# Patient Record
Sex: Male | Born: 1955
Health system: Southern US, Community
[De-identification: ages and names within clinical notes are randomized; demographics above are authoritative.]

## PROBLEM LIST (undated history)

## (undated) DIAGNOSIS — I10 Essential (primary) hypertension: Secondary | ICD-10-CM

## (undated) DIAGNOSIS — Z87442 Personal history of urinary calculi: Secondary | ICD-10-CM

## (undated) DIAGNOSIS — R51 Headache: Secondary | ICD-10-CM

## (undated) DIAGNOSIS — Z634 Disappearance and death of family member: Secondary | ICD-10-CM

## (undated) DIAGNOSIS — G4700 Insomnia, unspecified: Secondary | ICD-10-CM

## (undated) DIAGNOSIS — Z8619 Personal history of other infectious and parasitic diseases: Secondary | ICD-10-CM

## (undated) DIAGNOSIS — G43909 Migraine, unspecified, not intractable, without status migrainosus: Secondary | ICD-10-CM

## (undated) DIAGNOSIS — R519 Headache, unspecified: Secondary | ICD-10-CM

## (undated) HISTORY — DX: Essential (primary) hypertension: I10

## (undated) HISTORY — DX: Insomnia, unspecified: G47.00

## (undated) HISTORY — DX: Headache: R51

## (undated) HISTORY — DX: Personal history of urinary calculi: Z87.442

## (undated) HISTORY — DX: Disappearance and death of family member: Z63.4

## (undated) HISTORY — DX: Headache, unspecified: R51.9

## (undated) HISTORY — DX: Migraine, unspecified, not intractable, without status migrainosus: G43.909

## (undated) HISTORY — DX: Personal history of other infectious and parasitic diseases: Z86.19

---

## 2014-05-19 ENCOUNTER — Ambulatory Visit: Payer: Self-pay | Admitting: Family Medicine

## 2015-01-13 ENCOUNTER — Ambulatory Visit: Payer: Self-pay | Admitting: Family Medicine

## 2015-02-16 ENCOUNTER — Encounter: Payer: Self-pay | Admitting: Family Medicine

## 2015-02-16 ENCOUNTER — Ambulatory Visit (INDEPENDENT_AMBULATORY_CARE_PROVIDER_SITE_OTHER): Payer: BLUE CROSS/BLUE SHIELD | Admitting: Family Medicine

## 2015-02-16 VITALS — BP 98/60 | HR 91 | Temp 97.9°F | Ht 70.5 in | Wt 190.7 lb

## 2015-02-16 DIAGNOSIS — G4700 Insomnia, unspecified: Secondary | ICD-10-CM | POA: Diagnosis not present

## 2015-02-16 DIAGNOSIS — Z23 Encounter for immunization: Secondary | ICD-10-CM

## 2015-02-16 DIAGNOSIS — E785 Hyperlipidemia, unspecified: Secondary | ICD-10-CM | POA: Insufficient documentation

## 2015-02-16 DIAGNOSIS — Z566 Other physical and mental strain related to work: Secondary | ICD-10-CM | POA: Insufficient documentation

## 2015-02-16 DIAGNOSIS — I10 Essential (primary) hypertension: Secondary | ICD-10-CM | POA: Insufficient documentation

## 2015-02-16 DIAGNOSIS — Z7689 Persons encountering health services in other specified circumstances: Secondary | ICD-10-CM

## 2015-02-16 DIAGNOSIS — Z7189 Other specified counseling: Secondary | ICD-10-CM

## 2015-02-16 MED ORDER — ZOLPIDEM TARTRATE 10 MG PO TABS: 5.0000 mg | ORAL_TABLET | Freq: Every evening | ORAL | Status: DC | PRN

## 2015-02-16 MED ORDER — LISINOPRIL 40 MG PO TABS: 40.0000 mg | ORAL_TABLET | Freq: Every day | ORAL | Status: DC

## 2015-02-16 NOTE — Patient Instructions (Signed)
BEFORE YOU LEAVE: -schedule physical in 3-4 months, come fasting and we will plan to do labs that day  Please schedule CBT with Dr. Jason FilaBray to help with sleep and we will plan to eventually try to wean slowly off of the Ambien.  We recommend the following healthy lifestyle measures: - eat a healthy whole foods diet consisting of regular small meals composed of vegetables, fruits, beans, nuts, seeds, healthy meats such as white chicken and fish and whole grains.  - avoid sweets, white starchy foods, fried foods, fast food, processed foods, sodas, red meet and other fattening foods.  - get a least 150-300 minutes of aerobic exercise per week.   FOR IMPROVED SLEEP AND TO RESET YOUR SLEEP SCHEDULE: []  exercise 30 minutes daily  []  go to bed and wake up at the same time  []  keep bedroom cool, dark and quiet  []  reserve bed for sleep - do not read, watch TV, etc in bed  []  If you toss and turn more then 15-20 minutes get out of bed and list thoughts/do quite activity then go back to bed; repeat as needed; do not worry about when you eventually fall asleep - still get up at the same time and turn on lights and take shower  [] get counseling  []  some people find that a half dose of benadryl, melatonin, tylenol pm or unisom on a few nights per week is helpful initially for a few weeks  [] seek help and treat any depression or anxiety  [] prescription strength sleep medications (such as Ambien) should only be used in severe cases of insomnia if other measures fail and should be used for as short of time as possible

## 2015-02-16 NOTE — Progress Notes (Signed)
Pre visit review using our clinic review tool, if applicable. No additional management support is needed unless otherwise documented below in the visit note. 

## 2015-02-16 NOTE — Progress Notes (Signed)
HPI:  Melvin Faulkner is here to establish care. Used to see Melvin Faulkner whom let practice. Wife and children come here.  HTN: -dx in his 35s -meds: lisinopril  daily -reports stable -reports hx mild HLD treated with diet and exercise  Insomnia: -started 6 years ago with the loss of his son -[place]  5 nights per week since -does suffer from a lot of stress/anxiety related to work -denies depression -interested in eventually stopping [place] but has not had success with stopping on his own -bedtime 11, wakes up at 5 every day  ROS negative for unless reported above: fevers, unintentional weight loss, hearing or vision loss, chest pain, palpitations, struggling to breath, hemoptysis, melena, hematochezia, hematuria, falls, loc, si, thoughts of self harm  Past Medical History  Diagnosis Date  . Hypertension   . Frequent headaches   . History of chicken pox   . Migraine   . History of kidney stones   . Insomnia   . Bereavement     lost son 2010    History reviewed. No pertinent past surgical history.  Family History  Problem Relation Age of Onset  . Arthritis Paternal Grandmother   . Alcoholism Daughter   . Congestive Heart Failure Mother   . Kidney disease Father   . Mental illness Son     suicide    Social History   Social History  . Marital Status: Married    Spouse Name: N/A  . Number of Children: N/A  . Years of Education: N/A   Social History Main Topics  . Smoking status: Never Smoker   . Smokeless tobacco: None  . Alcohol Use: 0.0 oz/week    0 Standard drinks or equivalent per week     Comment: 1 drink twice per month  . Drug Use: None  . Sexual Activity: Not Asked   Other Topics Concern  . None   Social History Narrative   Updated 01/2015   Work or Archivist - Holiday representative      Home Situation: lives with wife and children - wife and children see me      Spiritual Beliefs: Christian - strong faith      Lifestyle: runs  several miles per day, weights; healthy diet           Current outpatient prescriptions:  .  lisinopril (PRINIVIL,ZESTRIL) 40 MG tablet, Take 1 tablet (40 mg total) by mouth daily., Disp: 90 tablet, Rfl: 3 .  zolpidem (AMBIEN) 10 MG tablet, Take 0.5 tablets (5 mg total) by mouth at bedtime as needed for sleep., Disp: 45 tablet, Rfl: 1  EXAM:  Filed Vitals:   02/16/15 1315  BP: 98/60  Pulse: 91  Temp: 97.9 F (36.6 C)    Body mass index is 26.97 kg/(m^2).  GENERAL: vitals reviewed and listed above, alert, oriented, appears well hydrated and in no acute distress  HEENT: atraumatic, conjunttiva clear, no obvious abnormalities on inspection of external nose and ears  NECK: no obvious masses on inspection  LUNGS: clear to auscultation bilaterally, no wheezes, rales or rhonchi, good air movement  CV: HRRR, no peripheral edema  MS: moves all extremities without noticeable abnormality  PSYCH: pleasant and cooperative, no obvious depression or anxiety  ASSESSMENT AND PLAN:  Discussed the following assessment and plan:  Stress at work Insomnia -advised stress reduction options, CBT, sleep hygeine and at some point slow taper off ambien when he feels ready after discussion risks - he opted to continue short term  Essential hypertension -cont current tx  Hyperlipemia -lifestyle recs  CPE in 3-4 months with labs   Encounter to establish care -We reviewed the PMH, PSH, FH, SH, Meds and Allergies. -We provided refills for any medications we will prescribe as needed. -We addressed current concerns per orders and patient instructions. -We have asked for records for pertinent exams, studies, vaccines and notes from previous providers. -We have advised patient to follow up per instructions below.   -Patient advised to return or notify a doctor immediately if symptoms worsen or persist or new concerns arise.  Patient Instructions  BEFORE YOU LEAVE: -schedule physical in  3-4 months, come fasting and we will plan to do labs that day  Please schedule CBT with Dr. Jason FilaBray to help with sleep and we will plan to eventually try to wean slowly off of the Ambien.  We recommend the following healthy lifestyle measures: - eat a healthy whole foods diet consisting of regular small meals composed of vegetables, fruits, beans, nuts, seeds, healthy meats such as white chicken and fish and whole grains.  - avoid sweets, white starchy foods, fried foods, fast food, processed foods, sodas, red meet and other fattening foods.  - get a least 150-300 minutes of aerobic exercise per week.   FOR IMPROVED SLEEP AND TO RESET YOUR SLEEP SCHEDULE: []  exercise 30 minutes daily  []  go to bed and wake up at the same time  []  keep bedroom cool, dark and quiet  []  reserve bed for sleep - do not read, watch TV, etc in bed  []  If you toss and turn more then 15-20 minutes get out of bed and list thoughts/do quite activity then go back to bed; repeat as needed; do not worry about when you eventually fall asleep - still get up at the same time and turn on lights and take shower  [] get counseling  []  some people find that a half dose of benadryl, melatonin, tylenol pm or unisom on a few nights per week is helpful initially for a few weeks  [] seek help and treat any depression or anxiety  [] prescription strength sleep medications (such as Ambien) should only be used in severe cases of insomnia if other measures fail and should be used for as short of time as possible      Melvin Faulkner, Melvin Faulkner.

## 2015-04-07 ENCOUNTER — Ambulatory Visit (INDEPENDENT_AMBULATORY_CARE_PROVIDER_SITE_OTHER): Payer: BLUE CROSS/BLUE SHIELD | Admitting: Psychology

## 2015-04-07 DIAGNOSIS — G4700 Insomnia, unspecified: Secondary | ICD-10-CM | POA: Diagnosis not present

## 2015-04-29 ENCOUNTER — Ambulatory Visit: Payer: BLUE CROSS/BLUE SHIELD | Admitting: Psychology

## 2015-05-14 ENCOUNTER — Ambulatory Visit (INDEPENDENT_AMBULATORY_CARE_PROVIDER_SITE_OTHER): Payer: BLUE CROSS/BLUE SHIELD | Admitting: Psychology

## 2015-05-14 DIAGNOSIS — G4700 Insomnia, unspecified: Secondary | ICD-10-CM | POA: Diagnosis not present

## 2015-05-19 ENCOUNTER — Encounter: Payer: Self-pay | Admitting: Family Medicine

## 2015-05-19 ENCOUNTER — Ambulatory Visit (INDEPENDENT_AMBULATORY_CARE_PROVIDER_SITE_OTHER): Payer: BLUE CROSS/BLUE SHIELD | Admitting: Family Medicine

## 2015-05-19 VITALS — BP 104/68 | HR 67 | Temp 97.9°F | Ht 70.25 in | Wt 198.2 lb

## 2015-05-19 DIAGNOSIS — Z0000 Encounter for general adult medical examination without abnormal findings: Secondary | ICD-10-CM

## 2015-05-19 DIAGNOSIS — I10 Essential (primary) hypertension: Secondary | ICD-10-CM | POA: Diagnosis not present

## 2015-05-19 DIAGNOSIS — E785 Hyperlipidemia, unspecified: Secondary | ICD-10-CM

## 2015-05-19 DIAGNOSIS — Z1211 Encounter for screening for malignant neoplasm of colon: Secondary | ICD-10-CM

## 2015-05-19 DIAGNOSIS — G4700 Insomnia, unspecified: Secondary | ICD-10-CM

## 2015-05-19 LAB — LIPID PANEL
CHOL/HDL RATIO: 4
CHOLESTEROL: 209 mg/dL — AB (ref 0–200)
HDL: 51.2 mg/dL (ref >39.00)
LDL CALC: 141 mg/dL — AB (ref 0–99)
NonHDL: 157.81
Triglycerides: 82 mg/dL (ref 0.0–149.0)
VLDL: 16.4 mg/dL (ref 0.0–40.0)

## 2015-05-19 LAB — BASIC METABOLIC PANEL
BUN: 20 mg/dL (ref 6–23)
CO2: 26 mEq/L (ref 19–32)
Calcium: 9.3 mg/dL (ref 8.4–10.5)
Chloride: 104 mEq/L (ref 96–112)
Creatinine, Ser: 1.1 mg/dL (ref 0.40–1.50)
GFR: 72.61 mL/min (ref >60.00)
Glucose, Bld: 102 mg/dL — ABNORMAL HIGH (ref 70–99)
POTASSIUM: 4.5 meq/L (ref 3.5–5.1)
SODIUM: 138 meq/L (ref 135–145)

## 2015-05-19 LAB — HEMOGLOBIN A1C: HEMOGLOBIN A1C: 5.6 % (ref 4.6–6.5)

## 2015-05-19 MED ORDER — ZOLPIDEM TARTRATE 5 MG PO TABS: 5.0000 mg | ORAL_TABLET | Freq: Every evening | ORAL | Status: DC | PRN

## 2015-05-19 NOTE — Progress Notes (Signed)
HPI:  Here for CPE:  -Concerns and/or follow up today:  HTN: -dx in his 22s -meds: lisinopril  daily -reports stable -reports hx mild HLD treated with diet and exercise  Insomnia: -started 6 years ago with the loss of his son -on [place]  5 nights per week since -does suffer from a lot of stress/anxiety related to work -denies depression -did CBT and wants to wean off of ambien due to side effects he has heard off  -bedtime 11, wakes up at 5 every day  ROS negative for unless reported above: fevers, unintentional weight loss, hearing or vision loss, chest pain, palpitations, struggling to breath, hemoptysis, melena, hematochezia, hematuria, falls, loc, si, thoughts of self harm   -Diet: variety of foods, balance and well rounded  -Exercise: regular exercise  -Diabetes and Dyslipidemia Screening: today  -Hx of HTN: yes treated  -Vaccines: UTD  -sexual activity: yes, male partner, no new partners  -wants STI testing, Hep C screening (if born 54-1965): agreed to hep c testing  -FH colon or prstate ca: see FH Last colon cancer screening: never done, prefers to do cologard Last prostate ca screening: declined after discussion risks and benefits  -Alcohol, Tobacco, drug use: see social history  Review of Systems - no fevers, unintentional weight loss, vision loss, hearing loss, chest pain, sob, hemoptysis, melena, hematochezia, hematuria, genital discharge, changing or concerning skin lesions, bleeding, bruising, loc, thoughts of self harm or SI  Past Medical History  Diagnosis Date  . Hypertension   . Frequent headaches   . History of chicken pox   . Migraine   . History of kidney stones   . Insomnia   . Bereavement     lost son 2010    No past surgical history on file.  Family History  Problem Relation Age of Onset  . Arthritis Paternal Grandmother   . Alcoholism Daughter   . Congestive Heart Failure Mother   . Kidney disease Father   .  Mental illness Son     suicide    Social History   Social History  . Marital Status: Married    Spouse Name: N/A  . Number of Children: N/A  . Years of Education: N/A   Social History Main Topics  . Smoking status: Never Smoker   . Smokeless tobacco: None  . Alcohol Use: 0.0 oz/week    0 Standard drinks or equivalent per week     Comment: 1 drink twice per month  . Drug Use: None  . Sexual Activity: Not Asked   Other Topics Concern  . None   Social History Narrative   Updated 01/2015   Work or Archivist - Holiday representative      Home Situation: lives with wife and children - wife and children see me      Spiritual Beliefs: Christian - strong faith      Lifestyle: runs several miles per day, weights; healthy diet           Current outpatient prescriptions:  .  lisinopril (PRINIVIL,ZESTRIL) 40 MG tablet, Take 1 tablet (40 mg total) by mouth daily., Disp: 90 tablet, Rfl: 3 .  zolpidem (AMBIEN) 5 MG tablet, Take 1 tablet (5 mg total) by mouth at bedtime as needed for sleep., Disp: 30 tablet, Rfl: 0  EXAM:  Filed Vitals:   05/19/15 0831  BP: 104/68  Pulse: 67  Temp: 97.9 F (36.6 C)  TempSrc: Oral  Height: 5' 10.25" (1.784 m)  Weight: 198 lb 3.2 oz (  89.903 kg)    Estimated body mass index is 28.25 kg/(m^2) as calculated from the following:   Height as of this encounter: 5' 10.25" (1.784 m).   Weight as of this encounter: 198 lb 3.2 oz (89.903 kg).  GENERAL: vitals reviewed and listed below, alert, oriented, appears well hydrated and in no acute distress  HEENT: head atraumatic, PERRLA, normal appearance of eyes, ears, nose and mouth. moist mucus membranes.  NECK: supple, no masses or lymphadenopathy  LUNGS: clear to auscultation bilaterally, no rales, rhonchi or wheeze  CV: HRRR, no peripheral edema or cyanosis, normal pedal pulses  ABDOMEN: bowel sounds normal, soft, non tender to palpation, no masses, no rebound or guarding  GU: normal appearance of  external genitalia - no lesions or masses, hernia exam normal.  RECTAL: refused  SKIN: no rash or abnormal lesions  MS: normal gait, moves all extremities normally  NEURO: CN II-XII grossly intact, normal muscle strength and sensation to light touch on extremities  PSYCH: normal affect, pleasant and cooperative  ASSESSMENT AND PLAN:  Discussed the following assessment and plan:  Visit for preventive health examination - Plan: Hemoglobin A1c, Hepatitis C antibody  Essential hypertension - Plan: Basic metabolic panel  Hyperlipemia - Plan: Lipid Panel, Hemoglobin A1c  Insomnia  Colon cancer screening - Plan: Cologuard   -Discussed and advised all [place] preventive services health task force level A and B recommendations for age, sex and risks.  -Advised at least 150 minutes of exercise per week and a healthy diet low in saturated fats and sweets and consisting of fresh fruits and vegetables, lean meats such as fish and white chicken and whole grains.  -FASTING labs, studies and vaccines per orders this encounter   Patient advised to return to clinic immediately if symptoms worsen or persist or new concerns.  Patient Instructions  BEFORE YOU LEAVE: -labs -schedule follow up in 6 months  -We have ordered labs or studies at this visit. It can take up to 1-2 weeks for results and processing. We will contact you with instructions IF your results are abnormal. Normal results will be released to your [facility]. If you have not heard from [place] or can not find your results in [facility] in 2 weeks please contact our office.  We recommend the following healthy lifestyle measures: - eat a healthy whole foods diet consisting of regular small meals composed of vegetables, fruits, beans, nuts, seeds, healthy meats such as white chicken and fish and whole grains.  - avoid sweets, white starchy foods, fried foods, fast food, processed foods, sodas, red meet and other fattening foods.  - get a least  150-300 minutes of aerobic exercise per week.   Wean off ambien: -1/2 tablet 5 nights per week for 1 week -then 1/2 tablet 4 nights for 1 week -then 1/2 tablet 3 nights for 1 week -and so on          No Follow-up on file.   Kriste Basque R.

## 2015-05-19 NOTE — Patient Instructions (Signed)
BEFORE YOU LEAVE: -labs -schedule follow up in 6 months  -We have ordered labs or studies at this visit. It can take up to 1-2 weeks for results and processing. We will contact you with instructions IF your results are abnormal. Normal results will be released to your [facility]. If you have not heard from [place] or can not find your results in [facility] in 2 weeks please contact our office.  We recommend the following healthy lifestyle measures: - eat a healthy whole foods diet consisting of regular small meals composed of vegetables, fruits, beans, nuts, seeds, healthy meats such as white chicken and fish and whole grains.  - avoid sweets, white starchy foods, fried foods, fast food, processed foods, sodas, red meet and other fattening foods.  - get a least 150-300 minutes of aerobic exercise per week.   Wean off ambien: -1/2 tablet 5 nights per week for 1 week -then 1/2 tablet 4 nights for 1 week -then 1/2 tablet 3 nights for 1 week -and so on

## 2015-05-19 NOTE — Progress Notes (Signed)
Pre visit review using our clinic review tool, if applicable. No additional management support is needed unless otherwise documented below in the visit note. 

## 2015-05-20 LAB — HEPATITIS C ANTIBODY: HCV Ab: NEGATIVE

## 2015-11-16 ENCOUNTER — Encounter: Payer: Self-pay | Admitting: Family Medicine

## 2015-11-16 ENCOUNTER — Ambulatory Visit: Payer: Self-pay | Admitting: Family Medicine

## 2015-11-16 ENCOUNTER — Ambulatory Visit (INDEPENDENT_AMBULATORY_CARE_PROVIDER_SITE_OTHER): Payer: BLUE CROSS/BLUE SHIELD | Admitting: Family Medicine

## 2015-11-16 VITALS — BP 102/60 | HR 64 | Temp 97.7°F | Ht 70.25 in | Wt 200.9 lb

## 2015-11-16 DIAGNOSIS — K219 Gastro-esophageal reflux disease without esophagitis: Secondary | ICD-10-CM | POA: Diagnosis not present

## 2015-11-16 DIAGNOSIS — G4700 Insomnia, unspecified: Secondary | ICD-10-CM | POA: Diagnosis not present

## 2015-11-16 DIAGNOSIS — I10 Essential (primary) hypertension: Secondary | ICD-10-CM | POA: Diagnosis not present

## 2015-11-16 NOTE — Patient Instructions (Signed)
BEFORE YOU LEAVE: -follow up: physical in January 2018  Can try 1/2 dose of your blood pressure medication and  monitor BP at home.  Can try zantac or Nexium for rare use for acid reflux if Tums or Rolaids does not help.  We recommend the following healthy lifestyle: 1) Small portions - eat off of salad plate instead of dinner plate 2) Eat a healthy clean diet with avoidance of (less then 1 serving per week) processed foods, sweetened drinks, white starches, red meat, fast foods and sweets and consisting of: * 5-9 servings per day of fresh or frozen fruits and vegetables (not corn or potatoes, not dried or canned) *nuts and seeds, beans *olives and olive oil *small portions of lean meats such as fish and white chicken  *small portions of whole grains 3)Get at least 150 minutes of sweaty aerobic exercise per week 4)reduce stress - counseling, meditation, relaxation to balance other aspects of your life

## 2015-11-16 NOTE — Progress Notes (Signed)
Pre visit review using our clinic review tool, if applicable. No additional management support is needed unless otherwise documented below in the visit note. 

## 2015-11-16 NOTE — Progress Notes (Signed)
HPI:  Follow up:  HTN: -dx in his 74s -meds: lisinopril 40mg  daily -looses about 10lbs in the summer due to exercise and then BP runs a little low with occ dizziness with standing -reports hx mild HLD treated with diet and exercise  Insomnia: -started 6 years ago with the loss of his son -weaned his ambien to 1/2 of 5mg  tablet once per week at most and feels cognitive function has improved -does suffer from a lot of stress/anxiety related to work -denies depression -bedtime 11, wakes up at 5 every day  Occ GERD: -if eats the wrong foods -rare -wonder what he can take OTC  ROS: See pertinent positives and negatives per HPI.  Past Medical History:  Diagnosis Date  . Bereavement    lost son 2010  . Frequent headaches   . History of chicken pox   . History of kidney stones   . Hypertension   . Insomnia   . Migraine     No past surgical history on file.  Family History  Problem Relation Age of Onset  . Arthritis Paternal Grandmother   . Alcoholism Daughter   . Congestive Heart Failure Mother   . Kidney disease Father   . Mental illness Son     suicide    Social History   Social History  . Marital status: Married    Spouse name: N/A  . Number of children: N/A  . Years of education: N/A   Social History Main Topics  . Smoking status: Never Smoker  . Smokeless tobacco: None  . Alcohol use 0.0 oz/week     Comment: 1 drink twice per month  . Drug use: Unknown  . Sexual activity: Not Asked   Other Topics Concern  . None   Social History Narrative   Updated 01/2015   Work or Archivist - Holiday representative      Home Situation: lives with wife and children - wife and children see me      Spiritual Beliefs: Christian - strong faith      Lifestyle: runs several miles per day, weights; healthy diet           Current Outpatient Prescriptions:  .  lisinopril (PRINIVIL,ZESTRIL) 40 MG tablet, Take 1 tablet (40 mg total) by mouth daily., Disp: 90 tablet,  Rfl: 3 .  zolpidem (AMBIEN) 5 MG tablet, Take 1 tablet (5 mg total) by mouth at bedtime as needed for sleep., Disp: 30 tablet, Rfl: 0  EXAM:  Vitals:   11/16/15 0908  BP: 102/60  Pulse: 64  Temp: 97.7 F (36.5 C)    Body mass index is 28.62 kg/m.  GENERAL: vitals reviewed and listed above, alert, oriented, appears well hydrated and in no acute distress  HEENT: atraumatic, conjunttiva clear, no obvious abnormalities on inspection of external nose and ears  NECK: no obvious masses on inspection  LUNGS: clear to auscultation bilaterally, no wheezes, rales or rhonchi, good air movement  CV: HRRR, no peripheral edema  MS: moves all extremities without noticeable abnormality  PSYCH: pleasant and cooperative, no obvious depression or anxiety  ASSESSMENT AND PLAN:  Discussed the following assessment and plan:  Essential hypertension  Insomnia  Gastroesophageal reflux disease, esophagitis presence not specified  -lifestyle recs -lower bp dose for summer and he plans to monitor at home -OTC options and lifestyle recs discussed for occ gerd and advised follow up if worsening or persists -CPE January -he plans to do cologuard -Patient advised to return or notify a doctor  immediately if symptoms worsen or persist or new concerns arise.  Patient Instructions  BEFORE YOU LEAVE: -follow up: physical in January 2018  Can try 1/2 dose of your blood pressure medication and  monitor BP at home.  Can try zantac or Nexium for rare use for acid reflux if Tums or Rolaids does not help.  We recommend the following healthy lifestyle: 1) Small portions - eat off of salad plate instead of dinner plate 2) Eat a healthy clean diet with avoidance of (less then 1 serving per week) processed foods, sweetened drinks, white starches, red meat, fast foods and sweets and consisting of: * 5-9 servings per day of fresh or frozen fruits and vegetables (not corn or potatoes, not dried or  canned) *nuts and seeds, beans *olives and olive oil *small portions of lean meats such as fish and white chicken  *small portions of whole grains 3)Get at least 150 minutes of sweaty aerobic exercise per week 4)reduce stress - counseling, meditation, relaxation to balance other aspects of your life       Kriste Basque R., DO

## 2016-03-11 ENCOUNTER — Other Ambulatory Visit: Payer: Self-pay | Admitting: Family Medicine

## 2016-05-22 NOTE — Progress Notes (Signed)
HPI:  Here for CPE:  -Concerns and/or follow up today:   URI: -for several week -intermittent resp symptoms- nasal congestions, sneezing, cough -no just occ cough -no fevers, sob, wheezing, sinus pain, ear pain  HTN: -dx in his 35s -meds: lisinopril 48m daily -reports thinks may be too low - sometimes a little dizzy when first stands up  Insomnia: -started with the loss of his son -rare 1/4 dose ambien prn -denies depression  Cyst on back: -recent flare, then resolved with abx from his eye doctor for sty -sees dermatologist and considering removal  -Diet: variety of foods, balance and well rounded  -Exercise: regular exercise  -Diabetes and Dyslipidemia Screening: fasting for labs  -Hx of HTN: no  -Vaccines: wants flu shot, declined shingles vaccine  -sexual activity: yes, male partner, no new partners  -wants STI testing, Hep C screening (if born [id number]: no  -FH colon or prstate ca: see FH Last colon cancer screening: ordered cologuard per his wishes last year - he admits did not complete but promises he will call the company and complete Last prostate ca screening: declined in 2017, discussed risks/benefits/limitations DRE/PSA today again and he wishes to do PSA  -Alcohol, Tobacco, drug use: see social history  Review of Systems - no fevers, unintentional weight loss, vision loss, hearing loss, chest pain, sob, hemoptysis, melena, hematochezia, hematuria, genital discharge, changing or concerning skin lesions, bleeding, bruising, loc, thoughts of self harm or SI  Past Medical History:  Diagnosis Date  . Bereavement    lost son 2010  . Frequent headaches   . History of chicken pox   . History of kidney stones   . Hypertension   . Insomnia   . Migraine     No past surgical history on file.  Family History  Problem Relation Age of Onset  . Arthritis Paternal Grandmother   . Alcoholism Daughter   . Congestive Heart Failure Mother   . Kidney  disease Father   . Mental illness Son     suicide    Social History   Social History  . Marital status: Married    Spouse name: N/A  . Number of children: N/A  . Years of education: N/A   Social History Main Topics  . Smoking status: Never Smoker  . Smokeless tobacco: Never Used  . Alcohol use 0.0 oz/week     Comment: 1 drink twice per month  . Drug use: Unknown  . Sexual activity: Not Asked   Other Topics Concern  . 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  lisinopril (PRINIVIL,ZESTRIL) 10 MG tablet, Take 1 tablet (10 mg total) by mouth daily., Disp: 90 tablet, Rfl: 3 .  zolpidem (AMBIEN) 5 MG tablet, Take 1 tablet (5 mg total) by mouth at bedtime as needed for sleep., Disp: 20 tablet, Rfl: 0  EXAM:  Vitals:   05/23/16 0743  BP: (!) 100/58  Pulse: 61  Temp: 97.6 F (36.4 C)  TempSrc: Oral  Weight: 199 lb 6.4 oz (90.4 kg)  Height: 5' 11.25" (1.81 m)    Estimated body mass index is  27.62 kg/m as calculated from the following:   Height as of this encounter: 5' 11.25" (1.81 m).   Weight as of this encounter: 199 lb 6.4 oz (90.4 kg).  GENERAL: vitals reviewed and listed below, alert, oriented, appears well hydrated and in no acute distress  HEENT: head atraumatic, PERRLA, normal appearance of eyes, ears, nose and mouth. moist mucus membranes, normal appearance of ear canals and TMs except for R MEE - clear, clear nasal congestion, mild post oropharyngeal erythema with PND, no tonsillar edema or exudate, no sinus TTP  NECK: supple, no masses or lymphadenopathy  LUNGS: clear to auscultation bilaterally, no rales, rhonchi or wheeze  CV: HRRR, no peripheral edema or  cyanosis, normal pedal pulses  ABDOMEN: bowel sounds normal, soft, non tender to palpation, no masses, no rebound or guarding  GU: declined  RECTAL: refused  SKIN: no rash or abnormal lesions except for healing cyst mid low back  MS: normal gait, moves all extremities normally  NEURO: normal gait, speech and thought processing grossly intact, muscle tone grossly intact throughout  PSYCH: normal affect, pleasant and cooperative  ASSESSMENT AND PLAN:  Discussed the following assessment and plan:  Encounter for preventive health examination - Plan: Hemoglobin A1c, PSA -Discussed and advised all [place] preventive services health task force level A and B recommendations for age, sex and risks. --Advised at least 150 minutes of exercise per week and a healthy diet with avoidance of (less then 1 serving per week) processed foods, white starches, red meat, fast foods and sweets and consisting of: * 5-9 servings of fresh fruits and vegetables (not corn or potatoes) *nuts and seeds, beans *olives and olive oil *lean meats such as fish and white chicken  *whole grains -labs, studies and vaccines per orders this encounter  Essential hypertension - Plan: Basic metabolic panel, CBC (no diff) -opted to try lower dose lisinopril, follow up in 1 month  Hyperlipidemia, unspecified hyperlipidemia type - Plan: Lipid Panel  Insomnia, unspecified type -discussed risks ambien again -he is using very cautiously -small amount refill provided  Cough -suspect post viral, normal exam except PND and R clear MEE -symptomatic care, otc options discussed, f/u 1 month  Patient advised to return to clinic immediately if symptoms worsen or persist or new concerns.  Patient Instructions  BEFORE YOU LEAVE: -flu shot -check on status cologuard to see if we need to reorder - he still has kit from last year -follow up: 1 month for blood pressure and cough -labs  Complete the cologuard for colon cancer  screening.  Decrease the lisinopril to 10 mg daily. I sent a new prescription.   No Follow-up on file.   Colin Benton R., DO

## 2016-05-23 ENCOUNTER — Encounter: Payer: Self-pay | Admitting: Family Medicine

## 2016-05-23 ENCOUNTER — Ambulatory Visit (INDEPENDENT_AMBULATORY_CARE_PROVIDER_SITE_OTHER): Payer: BLUE CROSS/BLUE SHIELD | Admitting: Family Medicine

## 2016-05-23 VITALS — BP 100/58 | HR 61 | Temp 97.6°F | Ht 71.25 in | Wt 199.4 lb

## 2016-05-23 DIAGNOSIS — G4700 Insomnia, unspecified: Secondary | ICD-10-CM | POA: Diagnosis not present

## 2016-05-23 DIAGNOSIS — Z23 Encounter for immunization: Secondary | ICD-10-CM

## 2016-05-23 DIAGNOSIS — I10 Essential (primary) hypertension: Secondary | ICD-10-CM | POA: Diagnosis not present

## 2016-05-23 DIAGNOSIS — Z0000 Encounter for general adult medical examination without abnormal findings: Secondary | ICD-10-CM

## 2016-05-23 DIAGNOSIS — R05 Cough: Secondary | ICD-10-CM

## 2016-05-23 DIAGNOSIS — Z0001 Encounter for general adult medical examination with abnormal findings: Secondary | ICD-10-CM

## 2016-05-23 DIAGNOSIS — E785 Hyperlipidemia, unspecified: Secondary | ICD-10-CM | POA: Diagnosis not present

## 2016-05-23 DIAGNOSIS — R059 Cough, unspecified: Secondary | ICD-10-CM

## 2016-05-23 LAB — BASIC METABOLIC PANEL
BUN: 21 mg/dL (ref 6–23)
CHLORIDE: 104 meq/L (ref 96–112)
CO2: 27 meq/L (ref 19–32)
Calcium: 9.2 mg/dL (ref 8.4–10.5)
Creatinine, Ser: 1.09 mg/dL (ref 0.40–1.50)
GFR: 73.13 mL/min (ref >60.00)
Glucose, Bld: 99 mg/dL (ref 70–99)
POTASSIUM: 4.7 meq/L (ref 3.5–5.1)
SODIUM: 138 meq/L (ref 135–145)

## 2016-05-23 LAB — LIPID PANEL
CHOL/HDL RATIO: 4
Cholesterol: 166 mg/dL (ref 0–200)
HDL: 44.4 mg/dL (ref >39.00)
LDL Cholesterol: 101 mg/dL — ABNORMAL HIGH (ref 0–99)
NONHDL: 121.47
Triglycerides: 102 mg/dL (ref 0.0–149.0)
VLDL: 20.4 mg/dL (ref 0.0–40.0)

## 2016-05-23 LAB — CBC
HEMATOCRIT: 46.6 % (ref 39.0–52.0)
HEMOGLOBIN: 16.1 g/dL (ref 13.0–17.0)
MCHC: 34.5 g/dL (ref 30.0–36.0)
MCV: 88.3 fl (ref 78.0–100.0)
PLATELETS: 255 10*3/uL (ref 150.0–400.0)
RBC: 5.28 Mil/uL (ref 4.22–5.81)
RDW: 13.7 % (ref 11.5–15.5)
WBC: 8.3 10*3/uL (ref 4.0–10.5)

## 2016-05-23 LAB — HEMOGLOBIN A1C: HEMOGLOBIN A1C: 5.7 % (ref 4.6–6.5)

## 2016-05-23 LAB — PSA: PSA: 0.76 ng/mL (ref 0.10–4.00)

## 2016-05-23 MED ORDER — ZOLPIDEM TARTRATE 5 MG PO TABS: 5.0000 mg | ORAL_TABLET | Freq: Every evening | ORAL | 0 refills | Status: DC | PRN

## 2016-05-23 MED ORDER — LISINOPRIL 10 MG PO TABS: 10.0000 mg | ORAL_TABLET | Freq: Every day | ORAL | 3 refills | Status: DC

## 2016-05-23 NOTE — Patient Instructions (Signed)
BEFORE YOU LEAVE: -flu shot -check on status cologuard to see if we need to reorder - he still has kit from last year -follow up: 1 month for blood pressure and cough -labs  Complete the cologuard for colon cancer screening.  Decrease the lisinopril to 10 mg daily. I sent a new prescription.

## 2016-05-23 NOTE — Progress Notes (Signed)
Pre visit review using our clinic review tool, if applicable. No additional management support is needed unless otherwise documented below in the visit note. 

## 2016-06-13 LAB — COLOGUARD: Cologuard: NEGATIVE

## 2016-06-20 ENCOUNTER — Encounter: Payer: Self-pay | Admitting: Family Medicine

## 2016-06-22 NOTE — Progress Notes (Signed)
HPI:  Follow up cough, ETD with MEE and hypertension. Lowered Lisinopril dose last visit. He actually stopped the lisinopril all together reports he feels great. He has been trying the Mediterranean diet, is eating healthy and is getting regular exercise. He reports that he actually feels better than he has in a very long time. He has had some increased stress at work and did notice occasional palpitations. He has had this before. He does not wish to pursue any evaluation for this at this time. His ears and cough have improved and he no longer has any symptoms.  He also has stopped his melatonin and rarely uses a very low dose of Ambien. He thinks he feels better off of these medications as well.Denies chest pain, shortness of breath, dyspnea on exertion chest discomfort with exertion, swelling or prolonged or frequent palpitations.  ROS: See pertinent positives and negatives per HPI.  Past Medical History:  Diagnosis Date  . Bereavement    lost son 2010  . Frequent headaches   . History of chicken pox   . History of kidney stones   . Hypertension   . Insomnia   . Migraine     No past surgical history on file.  Family History  Problem Relation Age of Onset  . Arthritis Paternal Grandmother   . Alcoholism Daughter   . Congestive Heart Failure Mother   . Kidney disease Father   . Mental illness Son     suicide    Social History   Social History  . Marital status: Married    Spouse name: N/A  . Number of children: N/A  . Years of education: N/A   Social History Main Topics  . Smoking status: Never Smoker  . Smokeless tobacco: Never Used  . Alcohol use 0.0 oz/week     Comment: 1 drink twice per month  . Drug use: Unknown  . Sexual activity: Not Asked   Other Topics Concern  . None   Social History Narrative   Updated 01/2015   Work or Archivist - Holiday representative      Home Situation: lives with wife and children - wife and children see me      Spiritual Beliefs:  Christian - strong faith      Lifestyle: runs several miles per day, weights; healthy diet           Current Outpatient Prescriptions:  .  zolpidem (AMBIEN) 5 MG tablet, Take 1 tablet (5 mg total) by mouth at bedtime as needed for sleep., Disp: 20 tablet, Rfl: 0  EXAM:  Vitals:   06/23/16 0847  BP: 122/72  Pulse: 66  Temp: 98.2 F (36.8 C)    Body mass index is 27.95 kg/m.  GENERAL: vitals reviewed and listed above, alert, oriented, appears well hydrated and in no acute distress  HEENT: atraumatic, conjunttiva clear, no obvious abnormalities on inspection of external nose and ears, clear middle ear effusion on the right  NECK: no obvious masses on inspection  LUNGS: clear to auscultation bilaterally, no wheezes, rales or rhonchi, good air movement  CV: HRRR, no peripheral edema  MS: moves all extremities without noticeable abnormality  PSYCH: pleasant and cooperative, no obvious depression or anxiety  ASSESSMENT AND PLAN:  Discussed the following assessment and plan:  Essential hypertension  Insomnia, unspecified type  Dysfunction of right eustachian tube  Palpitations  -Doing well with a healthy diet and exercise, will continue off of the blood pressure medication for now -Discussed workup for palpitations  including an echocardiogram and Holter monitor, he declined any of this for now, he agrees to call us if his symptoms become more frequent or he has other symptoms -His ear issues have resolved other than some clear fluid in the right ear, he obviously has a eustachian tube disorder, we did after his permission perform the Gulbreath technique and a near poor, both osteopathic manipulative therapy. He responded well to this, he reported he actually felt his ear open up and could hear better. -we also discussed his sleep and advised as needed rare use of melatonin rather than Ambien if possible -Patient advised to return or notify a doctor immediately if  symptoms worsen or persist or new concerns arise.  Patient Instructions  BEFORE YOU LEAVE: -follow up: 6 months or sooner as needed  Try saline and the techniques I showed you for the ear. Let me know if any issues with the ear.  Keep up the healthy diet and regular exercise.  Let me know if you have further palpitations.  **Coming March 12th**  [facility] Fast Track!!!  Same Day Appointments for Acute Care: Sprains, Injuries, cuts, abrasions Colds, flu, sore throats, cough, upset stomachs Fever, ear pain Sinus and eye infections Animal/insect bites  3 Easy Ways to Schedule: Walk-In Scheduling Call in scheduling Mychart Sign-up: [email or web address]             Kriste BasqueKIM, HANNAH R., DO

## 2016-06-23 ENCOUNTER — Encounter: Payer: Self-pay | Admitting: Family Medicine

## 2016-06-23 ENCOUNTER — Ambulatory Visit (INDEPENDENT_AMBULATORY_CARE_PROVIDER_SITE_OTHER): Payer: BLUE CROSS/BLUE SHIELD | Admitting: Family Medicine

## 2016-06-23 VITALS — BP 122/72 | HR 66 | Temp 98.2°F | Ht 71.25 in | Wt 201.8 lb

## 2016-06-23 DIAGNOSIS — G4700 Insomnia, unspecified: Secondary | ICD-10-CM

## 2016-06-23 DIAGNOSIS — H6981 Other specified disorders of Eustachian tube, right ear: Secondary | ICD-10-CM | POA: Diagnosis not present

## 2016-06-23 DIAGNOSIS — R002 Palpitations: Secondary | ICD-10-CM | POA: Diagnosis not present

## 2016-06-23 DIAGNOSIS — I10 Essential (primary) hypertension: Secondary | ICD-10-CM

## 2016-06-23 NOTE — Patient Instructions (Signed)
BEFORE YOU LEAVE: -follow up: 6 months or sooner as needed  Try saline and the techniques I showed you for the ear. Let me know if any issues with the ear.  Keep up the healthy diet and regular exercise.  Let me know if you have further palpitations.  **Coming March 12th**  [facility] Fast Track!!!  Same Day Appointments for Acute Care: Sprains, Injuries, cuts, abrasions Colds, flu, sore throats, cough, upset stomachs Fever, ear pain Sinus and eye infections Animal/insect bites  3 Easy Ways to Schedule: Walk-In Scheduling Call in scheduling Mychart Sign-up: [email or web address]

## 2016-06-23 NOTE — Progress Notes (Signed)
Pre visit review using our clinic review tool, if applicable. No additional management support is needed unless otherwise documented below in the visit note. 

## 2016-12-28 ENCOUNTER — Ambulatory Visit (INDEPENDENT_AMBULATORY_CARE_PROVIDER_SITE_OTHER): Payer: Managed Care, Other (non HMO) | Admitting: Family Medicine

## 2016-12-28 ENCOUNTER — Encounter: Payer: Self-pay | Admitting: Family Medicine

## 2016-12-28 VITALS — BP 132/82 | HR 64 | Temp 98.1°F | Ht 71.25 in | Wt 203.2 lb

## 2016-12-28 DIAGNOSIS — G4700 Insomnia, unspecified: Secondary | ICD-10-CM

## 2016-12-28 DIAGNOSIS — I10 Essential (primary) hypertension: Secondary | ICD-10-CM

## 2016-12-28 DIAGNOSIS — R519 Headache, unspecified: Secondary | ICD-10-CM

## 2016-12-28 DIAGNOSIS — R51 Headache: Secondary | ICD-10-CM

## 2016-12-28 MED ORDER — LISINOPRIL 10 MG PO TABS: 10.0000 mg | ORAL_TABLET | Freq: Every day | ORAL | 3 refills | Status: DC

## 2016-12-28 NOTE — Progress Notes (Signed)
HPI:  Melvin Faulkner is a pleasant 61 year old with a past medical history significant for hypertension, headaches and insomnia here for a follow-up visit. He had stopped his lisinopril at his last visit because he felt he no longer needed it. He had been doing regular exercise and was eating a Mediterranean diet with significant reduction in his blood pressure. He continues to exercise 1 hour per day and eat a healthy diet. However, he has noticed his blood pressure ranges in the 130s to 140s over 80s and he had a 150 systolic blood pressure at his dermatology visit recently. He occasionally has headaches when his blood pressures up. He wonders about restarting the medication. He has tried several the past and tolerated lisinopril the best, but still felt better off this medication and on. No chest pain, shortness of breath, swelling or vision changes. He no longer is using the Ambien. Feels like his sleep is better better.  ROS: See pertinent positives and negatives per HPI.  Past Medical History:  Diagnosis Date  . Bereavement    lost son 2010  . Frequent headaches   . History of chicken pox   . History of kidney stones   . Hypertension   . Insomnia   . Migraine     No past surgical history on file.  Family History  Problem Relation Age of Onset  . Arthritis Paternal Grandmother   . Alcoholism Daughter   . Congestive Heart Failure Mother   . Kidney disease Father   . Mental illness Son        suicide    Social History   Social History  . Marital status: Married    Spouse name: N/A  . Number of children: N/A  . Years of education: N/A   Social History Main Topics  . Smoking status: Never Smoker  . Smokeless tobacco: Never Used  . Alcohol use 0.0 oz/week     Comment: 1 drink twice per month  . Drug use: Unknown  . Sexual activity: Not Asked   Other Topics Concern  . None   Social History Narrative   Updated 01/2015   Work or Archivistchool:Manager -  Holiday representativeconstruction      Home Situation: lives with wife and children - wife and children see me      Spiritual Beliefs: Christian - strong faith      Lifestyle: runs several miles per day, weights; healthy diet           Current Outpatient Prescriptions:  .  zolpidem (AMBIEN) 5 MG tablet, Take 1 tablet (5 mg total) by mouth at bedtime as needed for sleep., Disp: 20 tablet, Rfl: 0 .  lisinopril (PRINIVIL,ZESTRIL) 10 MG tablet, Take 1 tablet (10 mg total) by mouth daily., Disp: 30 tablet, Rfl: 3  EXAM:  Vitals:   12/28/16 0756  BP: 132/82  Pulse: 64  Temp: 98.1 F (36.7 C)    Body mass index is 28.14 kg/m.  GENERAL: vitals reviewed and listed above, alert, oriented, appears well hydrated and in no acute distress  HEENT: atraumatic, conjunttiva clear, no obvious abnormalities on inspection of external nose and ears  NECK: no obvious masses on inspection  LUNGS: clear to auscultation bilaterally, no wheezes, rales or rhonchi, good air movement  CV: HRRR, no peripheral edema  MS: moves all extremities without noticeable abnormality  PSYCH: pleasant and cooperative, no obvious depression or anxiety  ASSESSMENT AND PLAN:  Discussed the following assessment and plan:  Essential hypertension  Insomnia,  unspecified type  Frequent headaches  -Discussed risk and benefits of various treatments for hypertension -opted to restart lisinopril at a low dose for the blood pressure and for the headaches -We will plan to check labs and do the flu shot at the next visit -Congratulated on healthy lifestyle and encouraged to continue -Sleep article provided -Patient advised to return or notify a doctor immediately if symptoms worsen or persist or new concerns arise.  Patient Instructions  BEFORE YOU LEAVE: -sleep article -follow up: 1 month, labs then - flu shot then  Start the lisinopril 5 or  daily.   WE NOW OFFER   [facility] FAST TRACK!!!  SAME DAY  Appointments for ACUTE CARE  Such as: Sprains, Injuries, cuts, abrasions, rashes, muscle pain, joint pain, back pain Colds, flu, sore throats, headache, allergies, cough, fever  Ear pain, sinus and eye infections Abdominal pain, nausea, vomiting, diarrhea, upset stomach Animal/insect bites  3 Easy Ways to Schedule: Walk-In Scheduling Call in scheduling Mychart Sign-up: [email or web address]           Kriste Basque R., DO

## 2016-12-28 NOTE — Patient Instructions (Addendum)
BEFORE YOU LEAVE: -sleep article -follow up: 1 month, labs then - flu shot then  Start the lisinopril 5 or 10mg  daily.   WE NOW OFFER   [facility] FAST TRACK!!!  SAME DAY Appointments for ACUTE CARE  Such as: Sprains, Injuries, cuts, abrasions, rashes, muscle pain, joint pain, back pain Colds, flu, sore throats, headache, allergies, cough, fever  Ear pain, sinus and eye infections Abdominal pain, nausea, vomiting, diarrhea, upset stomach Animal/insect bites  3 Easy Ways to Schedule: Walk-In Scheduling Call in scheduling Mychart Sign-up: [email or web address]

## 2017-01-23 NOTE — Progress Notes (Signed)
HPI:  Follow up hypertension, insomnia and frequent headaches. Restarted lisinopril last visit. Reports he is doing much better. Has had very few headaches since doing the lisinopril 5 mg daily. Blood pressure is better. While to travel with work and does not sleep well when he is traveling, but otherwise dll. CBT/hygeine for sleep advised.Wants to get labs today. Declined flu shot today as he plans to get this after an upcoming trip.  ROS: See pertinent positives and negatives per HPI.  Past Medical History:  Diagnosis Date  . Bereavement    lost son 2010  . Frequent headaches   . History of chicken pox   . History of kidney stones   . Hypertension   . Insomnia   . Migraine     No past surgical history on file.  Family History  Problem Relation Age of Onset  . Arthritis Paternal Grandmother   . Alcoholism Daughter   . Congestive Heart Failure Mother   . Kidney disease Father   . Mental illness Son        suicide    Social History   Social History  . Marital status: Married    Spouse name: N/A  . Number of children: N/A  . Years of education: N/A   Social History Main Topics  . Smoking status: Never Smoker  . Smokeless tobacco: Never Used  . Alcohol use 0.0 oz/week     Comment: 1 drink twice per month  . Drug use: Unknown  . Sexual activity: Not Asked   Other Topics Concern  . None   Social History Narrative   Updated 01/2015   Work or Archivist - Holiday representative      Home Situation: lives with wife and children - wife and children see me      Spiritual Beliefs: Christian - strong faith      Lifestyle: runs several miles per day, weights; healthy diet           Current Outpatient Prescriptions:  .  lisinopril (PRINIVIL,ZESTRIL) 10 MG tablet, Take 1 tablet (10 mg total) by mouth daily., Disp: 30 tablet, Rfl: 3 .  zolpidem (AMBIEN) 5 MG tablet, Take 1 tablet (5 mg total) by mouth at bedtime as needed for sleep., Disp: 20 tablet, Rfl:  0  EXAM:  Vitals:   01/25/17 0843  BP: 100/62  Pulse: 73  Temp: 98 F (36.7 C)    Body mass index is 27.67 kg/m.  GENERAL: vitals reviewed and listed above, alert, oriented, appears well hydrated and in no acute distress  HEENT: atraumatic, conjunttiva clear, no obvious abnormalities on inspection of external nose and ears  NECK: no obvious masses on inspection  LUNGS: clear to auscultation bilaterally, no wheezes, rales or rhonchi, good air movement  CV: HRRR, no peripheral edema  MS: moves all extremities without noticeable abnormality  PSYCH: pleasant and cooperative, no obvious depression or anxiety  ASSESSMENT AND PLAN:  Discussed the following assessment and plan:  Essential hypertension - Plan: CBC, Basic metabolic panel  Hyperlipidemia, unspecified hyperlipidemia type - Plan: Lipid panel  Insomnia, unspecified type  -Glad he is doing better -Blood pressure looks good, labs today -Lifestyle recommendations -Up in 4 months -He declined flu shot today but plans to get later -Patient advised to return or notify a doctor immediately if symptoms worsen or persist or new concerns arise.  Patient Instructions  BEFORE YOU LEAVE: -follow up:4 months -Labs  Schedule flu shot sometime in the next one month.  We have  ordered labs or studies at this visit. It can take up to 1-2 weeks for results and processing. IF results require follow up or explanation, we will call you with instructions. Clinically stable results will be released to your [facility]. If you have not heard from [place] or cannot find your results in [facility] in 2 weeks please contact our office at [phone].  If you are not yet signed up for [facility], please consider signing up.  Advise regular aerobic exercise (at least 150 minutes per week of sweaty exercise) and a healthy diet. Try to eat at least 5-9 servings of vegetables and fruits per day (not corn, potatoes or bananas.) Avoid sweets, red meat,  pork, butter, fried foods, fast food, processed food, excessive dairy, eggs and coconut. Replace bad fats with good fats - fish, nuts and seeds, canola oil, olive oil.    WE NOW OFFER   [facility] FAST TRACK!!!  SAME DAY Appointments for ACUTE CARE  Such as: Sprains, Injuries, cuts, abrasions, rashes, muscle pain, joint pain, back pain Colds, flu, sore throats, headache, allergies, cough, fever  Ear pain, sinus and eye infections Abdominal pain, nausea, vomiting, diarrhea, upset stomach Animal/insect bites  3 Easy Ways to Schedule: Walk-In Scheduling Call in scheduling Mychart Sign-up: [email or web address]                 Kriste Basque R., DO

## 2017-01-25 ENCOUNTER — Encounter: Payer: Self-pay | Admitting: Family Medicine

## 2017-01-25 ENCOUNTER — Ambulatory Visit (INDEPENDENT_AMBULATORY_CARE_PROVIDER_SITE_OTHER): Payer: Managed Care, Other (non HMO) | Admitting: Family Medicine

## 2017-01-25 VITALS — BP 100/62 | HR 73 | Temp 98.0°F | Ht 71.25 in | Wt 199.8 lb

## 2017-01-25 DIAGNOSIS — G4700 Insomnia, unspecified: Secondary | ICD-10-CM | POA: Diagnosis not present

## 2017-01-25 DIAGNOSIS — E785 Hyperlipidemia, unspecified: Secondary | ICD-10-CM | POA: Diagnosis not present

## 2017-01-25 DIAGNOSIS — I10 Essential (primary) hypertension: Secondary | ICD-10-CM

## 2017-01-25 LAB — BASIC METABOLIC PANEL
BUN: 20 mg/dL (ref 6–23)
CHLORIDE: 103 meq/L (ref 96–112)
CO2: 27 mEq/L (ref 19–32)
Calcium: 10 mg/dL (ref 8.4–10.5)
Creatinine, Ser: 1.18 mg/dL (ref 0.40–1.50)
GFR: 66.58 mL/min (ref >60.00)
Glucose, Bld: 103 mg/dL — ABNORMAL HIGH (ref 70–99)
POTASSIUM: 5 meq/L (ref 3.5–5.1)
Sodium: 138 mEq/L (ref 135–145)

## 2017-01-25 LAB — LIPID PANEL
CHOLESTEROL: 232 mg/dL — AB (ref 0–200)
HDL: 50.4 mg/dL (ref >39.00)
LDL CALC: 162 mg/dL — AB (ref 0–99)
NonHDL: 181.32
TRIGLYCERIDES: 96 mg/dL (ref 0.0–149.0)
Total CHOL/HDL Ratio: 5
VLDL: 19.2 mg/dL (ref 0.0–40.0)

## 2017-01-25 LAB — CBC
HCT: 50.3 % (ref 39.0–52.0)
Hemoglobin: 17 g/dL (ref 13.0–17.0)
MCHC: 33.8 g/dL (ref 30.0–36.0)
MCV: 90.2 fl (ref 78.0–100.0)
PLATELETS: 249 10*3/uL (ref 150.0–400.0)
RBC: 5.58 Mil/uL (ref 4.22–5.81)
RDW: 14 % (ref 11.5–15.5)
WBC: 8.1 10*3/uL (ref 4.0–10.5)

## 2017-01-25 NOTE — Patient Instructions (Signed)
BEFORE YOU LEAVE: -follow up:4 months -Labs  Schedule flu shot sometime in the next one month.  We have ordered labs or studies at this visit. It can take up to 1-2 weeks for results and processing. IF results require follow up or explanation, we will call you with instructions. Clinically stable results will be released to your [facility]. If you have not heard from [place] or cannot find your results in [facility] in 2 weeks please contact our office at [phone].  If you are not yet signed up for [facility], please consider signing up.  Advise regular aerobic exercise (at least 150 minutes per week of sweaty exercise) and a healthy diet. Try to eat at least 5-9 servings of vegetables and fruits per day (not corn, potatoes or bananas.) Avoid sweets, red meat, pork, butter, fried foods, fast food, processed food, excessive dairy, eggs and coconut. Replace bad fats with good fats - fish, nuts and seeds, canola oil, olive oil.    WE NOW OFFER   [facility] FAST TRACK!!!  SAME DAY Appointments for ACUTE CARE  Such as: Sprains, Injuries, cuts, abrasions, rashes, muscle pain, joint pain, back pain Colds, flu, sore throats, headache, allergies, cough, fever  Ear pain, sinus and eye infections Abdominal pain, nausea, vomiting, diarrhea, upset stomach Animal/insect bites  3 Easy Ways to Schedule: Walk-In Scheduling Call in scheduling Mychart Sign-up: [email or web address]

## 2017-05-15 ENCOUNTER — Other Ambulatory Visit: Payer: Self-pay | Admitting: *Deleted

## 2017-05-15 MED ORDER — ZOLPIDEM TARTRATE 5 MG PO TABS: 5.0000 mg | ORAL_TABLET | Freq: Every evening | ORAL | 0 refills | Status: DC | PRN

## 2017-05-15 NOTE — Telephone Encounter (Signed)
I called the Rx in to [facility].

## 2017-05-27 NOTE — Progress Notes (Signed)
HPI:  Melvin Faulkner is a pleasant 62 y.o. here for follow up. Chronic medical problems summarized below were reviewed for changes and stability and were updated as needed below. These issues and their treatment remain stable for the most part.  Reports he is eating healthier, and getting about an hour of exercise.  Half of the lisinopril and feels is tolerating it well.  He would prefer not to take a statin.  He uses his sleep aid very rarely and is aware of risks. Denies CP, SOB, DOE, treatment intolerance or new symptoms. Due for labs, ? statin  HLD/Hyperglycemia/OVerweight: -lifetyle recs advised  -considering statin  HTN: -meds: lisinopril  Frequent headaches/Migraines: -lisinopril helped  Insomnia: -more when travels, start remotely with los of son -uses sleep aide rarely and with caution -CBT and sleep hygeine advised  ROS: See pertinent positives and negatives per HPI.  Past Medical History:  Diagnosis Date  . Bereavement    lost son 2010  . Frequent headaches   . History of chicken pox   . History of kidney stones   . Hypertension   . Insomnia   . Migraine     History reviewed. No pertinent surgical history.  Family History  Problem Relation Age of Onset  . Arthritis Paternal Grandmother   . Alcoholism Daughter   . Congestive Heart Failure Mother   . Kidney disease Father   . Mental illness Son        suicide    Social History   Socioeconomic History  . Marital status: Married    Spouse name: None  . Number of children: None  . Years of education: None  . Highest education level: None  Social Needs  . Financial resource strain: None  . Food insecurity - worry: None  . Food insecurity - inability: None  . Transportation needs - medical: None  . Transportation needs - non-medical: None  Occupational History  . None  Tobacco Use  . Smoking status: Never Smoker  . Smokeless tobacco: Never Used  Substance and Sexual Activity  . Alcohol  use: Yes    Alcohol/week: 0.0 oz    Comment: 1 drink twice per month  . Drug use: None  . Sexual activity: None  Other Topics Concern  . 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  lisinopril (PRINIVIL,ZESTRIL) 5 MG tablet, Take 1 tablet (5 mg total) by mouth daily., Disp: 90 tablet, Rfl: 3 .  zolpidem (AMBIEN) 5 MG tablet, Take 1 tablet (5 mg total) by mouth at bedtime as needed for sleep., Disp: 20 tablet, Rfl: 0  EXAM:  Vitals:   05/28/17 0910  BP: 118/72  Pulse: 73  Temp: 97.8 F (36.6 C)    Body mass index is 29 kg/m.  GENERAL: vitals reviewed and listed above, alert, oriented, appears well hydrated and in no acute distress  HEENT: atraumatic, conjunttiva clear, no obvious abnormalities on inspection of external nose and ears  NECK: no obvious masses on inspection  LUNGS: clear to auscultation bilaterally, no wheezes, rales or rhonchi, good air movement  CV: HRRR, no peripheral edema  MS: moves all  extremities without noticeable abnormality  PSYCH: pleasant and cooperative, no obvious depression or anxiety  ASSESSMENT AND PLAN:  Discussed the following assessment and plan:  Essential hypertension  Hyperlipidemia, unspecified hyperlipidemia type  Insomnia, unspecified type  -Congratulated on healthy lifestyle and encouraged to continue -Well on 5 mg of lisinopril, new prescription sent -Advised recheck of lipids, he prefers to do at his next visit -discussed risks with sleep aide, uses Rarely -Patient advised to return or notify a doctor immediately if symptoms worsen or persist or new concerns arise.  Patient Instructions  BEFORE YOU LEAVE: -follow up: CPE (physical)  in 3-4 months, come fasting  I sent the new dose of the lisinopril to the pharmacy.   We recommend the following healthy lifestyle for LIFE: 1) Small portions. But, make sure to get regular (at least 3 per day), healthy meals and small healthy snacks if needed.  2) Eat a healthy clean diet.   TRY TO EAT: -at least 5-7 servings of low sugar, colorful, and nutrient rich vegetables per day (not corn, potatoes or bananas.) -berries are the best choice if you wish to eat fruit (only eat small amounts if trying to reduce weight)  -lean meets (fish, white meat of chicken or [place]key) -vegan proteins for some meals - beans or tofu, whole grains, nuts and seeds -Replace bad fats with good fats - good fats include: fish, nuts and seeds, canola oil, olive oil -small amounts of low fat or non fat dairy -small amounts of100 % whole grains - check the lables -drink plenty of water  AVOID: -SUGAR, sweets, anything with added sugar, corn syrup or sweeteners - must read labels as even foods advertised as "healthy" often are loaded with sugar -if you must have a sweetener, small amounts of stevia may be best -sweetened beverages and artificially sweetened beverages -simple starches (rice, bread, potatoes, pasta, chips, etc - small amounts of 100% whole grains are ok) -red meat, pork, butter -fried foods, fast food, processed food, excessive dairy, eggs and coconut.  3)Get at least 150 minutes of sweaty aerobic exercise per week.  4)Reduce stress - consider counseling, meditation and relaxation to balance other aspects of your life.       Terressa KoyanagiHannah R Shenelle Klas, DO

## 2017-05-28 ENCOUNTER — Ambulatory Visit: Payer: Managed Care, Other (non HMO) | Admitting: Family Medicine

## 2017-05-28 ENCOUNTER — Encounter: Payer: Self-pay | Admitting: Family Medicine

## 2017-05-28 VITALS — BP 118/72 | HR 73 | Temp 97.8°F | Ht 71.25 in | Wt 209.4 lb

## 2017-05-28 DIAGNOSIS — E785 Hyperlipidemia, unspecified: Secondary | ICD-10-CM

## 2017-05-28 DIAGNOSIS — I10 Essential (primary) hypertension: Secondary | ICD-10-CM | POA: Diagnosis not present

## 2017-05-28 DIAGNOSIS — G4700 Insomnia, unspecified: Secondary | ICD-10-CM | POA: Diagnosis not present

## 2017-05-28 MED ORDER — LISINOPRIL 5 MG PO TABS: 5.0000 mg | ORAL_TABLET | Freq: Every day | ORAL | 3 refills | Status: DC

## 2017-05-28 NOTE — Patient Instructions (Addendum)
BEFORE YOU LEAVE: -follow up: CPE (physical) in 3-4 months, come fasting  I sent the new dose of the lisinopril to the pharmacy.   We recommend the following healthy lifestyle for LIFE: 1) Small portions. But, make sure to get regular (at least 3 per day), healthy meals and small healthy snacks if needed.  2) Eat a healthy clean diet.   TRY TO EAT: -at least 5-7 servings of low sugar, colorful, and nutrient rich vegetables per day (not corn, potatoes or bananas.) -berries are the best choice if you wish to eat fruit (only eat small amounts if trying to reduce weight)  -lean meets (fish, white meat of chicken or [place]) -vegan proteins for some meals - beans or tofu, whole grains, nuts and seeds -Replace bad fats with good fats - good fats include: fish, nuts and seeds, canola oil, olive oil -small amounts of low fat or non fat dairy -small amounts of100 % whole grains - check the lables -drink plenty of water  AVOID: -SUGAR, sweets, anything with added sugar, corn syrup or sweeteners - must read labels as even foods advertised as "healthy" often are loaded with sugar -if you must have a sweetener, small amounts of stevia may be best -sweetened beverages and artificially sweetened beverages -simple starches (rice, bread, potatoes, pasta, chips, etc - small amounts of 100% whole grains are ok) -red meat, pork, butter -fried foods, fast food, processed food, excessive dairy, eggs and coconut.  3)Get at least 150 minutes of sweaty aerobic exercise per week.  4)Reduce stress - consider counseling, meditation and relaxation to balance other aspects of your life.

## 2017-07-31 ENCOUNTER — Encounter: Payer: Self-pay | Admitting: Adult Health

## 2017-07-31 ENCOUNTER — Ambulatory Visit (INDEPENDENT_AMBULATORY_CARE_PROVIDER_SITE_OTHER): Payer: Managed Care, Other (non HMO)

## 2017-07-31 ENCOUNTER — Ambulatory Visit: Payer: Managed Care, Other (non HMO) | Admitting: Adult Health

## 2017-07-31 VITALS — BP 122/68 | Temp 98.1°F | Wt 206.0 lb

## 2017-07-31 DIAGNOSIS — R300 Dysuria: Secondary | ICD-10-CM | POA: Diagnosis not present

## 2017-07-31 DIAGNOSIS — R1012 Left upper quadrant pain: Secondary | ICD-10-CM | POA: Diagnosis not present

## 2017-07-31 LAB — POC URINALSYSI DIPSTICK (AUTOMATED)
Bilirubin, UA: NEGATIVE
Blood, UA: NEGATIVE
Glucose, UA: NEGATIVE
Ketones, UA: NEGATIVE
LEUKOCYTES UA: NEGATIVE
NITRITE UA: NEGATIVE
PROTEIN UA: NEGATIVE
SPEC GRAV UA: 1.025 (ref 1.010–1.025)
UROBILINOGEN UA: 1 U/dL
pH, UA: 6 (ref 5.0–8.0)

## 2017-07-31 NOTE — Progress Notes (Signed)
Subjective:    Patient ID: Melvin Faulkner, male    DOB: November 06, 1955, 62 y.o.   MRN: [id number]  HPI  62 year old male who  has a past medical history of Bereavement, Frequent headaches, History of chicken pox, History of kidney stones, Hypertension, Insomnia, and Migraine.  Patient of Dr. Selena Batten, who I am seeing today for an acute issue. Report h/o kidney stones. Current symptoms include low grade fever (100.5), decreased stream,dysuria, and discomfort in pelvic region.    Review of Systems See HPI   Past Medical History:  Diagnosis Date  . Bereavement    lost son 2010  . Frequent headaches   . History of chicken pox   . History of kidney stones   . Hypertension   . Insomnia   . Migraine     Social History   Socioeconomic History  . Marital status: Married    Spouse name: Not on file  . Number of children: Not on file  . Years of education: Not on file  . Highest education level: Not on file  Occupational History  . Not on file  Social Needs  . Financial resource strain: Not on file  . Food insecurity:    Worry: Not on file    Inability: Not on file  . Transportation needs:    Medical: Not on file    Non-medical: Not on file  Tobacco Use  . Smoking status: Never Smoker  . Smokeless tobacco: Never Used  Substance and Sexual Activity  . Alcohol use: Yes    Alcohol/week: 0.0 oz    Comment: 1 drink twice per month  . Drug use: Not on file  . Sexual activity: Not on file  Lifestyle  . Physical activity:    Days per week: Not on file    Minutes per session: Not on file  . Stress: Not on file  Relationships  . Social connections:    Talks on phone: Not on file    Gets together: Not on file    Attends religious service: Not on file    Active member of club or organization: Not on file    Attends meetings of clubs or organizations: Not on file    Relationship status: Not on file  . Intimate partner violence:    Fear of current or ex partner: Not on file     Emotionally abused: Not on file    Physically abused: Not on file    Forced sexual activity: Not on file  Other Topics Concern  . Not on file  Social History Narrative   Updated 01/2015   Work or Archivist - Holiday representative      Home Situation: lives with wife and children - wife and children see me      Spiritual Beliefs: Christian - strong faith      Lifestyle: runs several miles per day, weights; healthy diet       History reviewed. No pertinent surgical history.  Family History  Problem Relation Age of Onset  . Arthritis Paternal Grandmother   . Alcoholism Daughter   . Congestive Heart Failure Mother   . Kidney disease Father   . Mental illness Son        suicide    No Known Allergies  Current Outpatient Medications on File Prior to Visit  Medication Sig Dispense Refill  . lisinopril (PRINIVIL,ZESTRIL) 5 MG tablet Take 1 tablet (5 mg total) by mouth daily. 90 tablet 3  . zolpidem (AMBIEN)  5 MG tablet Take 1 tablet (5 mg total) by mouth at bedtime as needed for sleep. (Patient not taking: Reported on 07/31/2017) 20 tablet 0   No current facility-administered medications on file prior to visit.     BP 122/68 (BP Location: Left Arm, Patient Position: Sitting, Cuff Size: Large)   Temp 98.1 F (36.7 C) (Oral)   Wt 206 lb (93.4 kg)   BMI 28.53 kg/m       Objective:   Physical Exam  Constitutional: He is oriented to person, place, and time. He appears well-developed and well-nourished. No distress.  Cardiovascular: Normal rate, regular rhythm, normal heart sounds and intact distal pulses. Exam reveals no gallop and no friction rub.  No murmur heard. Pulmonary/Chest: Effort normal and breath sounds normal. No respiratory distress. He has no wheezes. He has no rales. He exhibits no tenderness.  Abdominal: Soft. Normal appearance and bowel sounds are normal. There is no tenderness. There is no CVA tenderness.  Neurological: He is alert and oriented to person,  place, and time.  Skin: Skin is warm and dry. No rash noted. No erythema. No pallor.  Psychiatric: He has a normal mood and affect. His behavior is normal. Thought content normal.  Nursing note and vitals reviewed.     Assessment & Plan:  1. Dysuria - Urine clean. Will check KUB for kidney stone - POCT Urinalysis Dipstick (Automated)- negative  - DG Abd 1 View; Future   Shirline Freesory Riyan Gavina, NP

## 2017-08-01 ENCOUNTER — Telehealth: Payer: Self-pay | Admitting: Adult Health

## 2017-08-01 ENCOUNTER — Other Ambulatory Visit: Payer: Self-pay | Admitting: Family Medicine

## 2017-08-01 ENCOUNTER — Other Ambulatory Visit: Payer: Managed Care, Other (non HMO)

## 2017-08-01 DIAGNOSIS — R1012 Left upper quadrant pain: Secondary | ICD-10-CM

## 2017-08-01 DIAGNOSIS — R300 Dysuria: Secondary | ICD-10-CM

## 2017-08-01 LAB — POC URINALSYSI DIPSTICK (AUTOMATED)
Bilirubin, UA: NEGATIVE
Blood, UA: NEGATIVE
GLUCOSE UA: NEGATIVE
KETONES UA: NEGATIVE
Leukocytes, UA: NEGATIVE
Nitrite, UA: NEGATIVE
Protein, UA: NEGATIVE
SPEC GRAV UA: 1.025 (ref 1.010–1.025)
UROBILINOGEN UA: 2 U/dL — AB
pH, UA: 6 (ref 5.0–8.0)

## 2017-08-01 MED ORDER — TAMSULOSIN HCL 0.4 MG PO CAPS: 0.4000 mg | ORAL_CAPSULE | Freq: Every day | ORAL | 3 refills | Status: DC

## 2017-08-01 NOTE — Telephone Encounter (Signed)
Patient called back and advised he had a fever up to 100.5, chills, and dysuria.   KUB negative for stone but had moderate stool burden   Will get repeat UA, CBC, BMP, and CT kidney stone

## 2017-08-01 NOTE — Addendum Note (Signed)
Addended by: Bonnye FavaKWEI, NANA K on: 08/01/2017 04:08 PM   Modules accepted: Orders

## 2017-08-02 LAB — CBC WITH DIFFERENTIAL/PLATELET
Basophils Absolute: 0.2 10*3/uL — ABNORMAL HIGH (ref 0.0–0.1)
Basophils Relative: 1.2 % (ref 0.0–3.0)
EOS PCT: 4 % (ref 0.0–5.0)
Eosinophils Absolute: 0.5 10*3/uL (ref 0.0–0.7)
HCT: 46 % (ref 39.0–52.0)
HEMOGLOBIN: 15.7 g/dL (ref 13.0–17.0)
LYMPHS ABS: 2.3 10*3/uL (ref 0.7–4.0)
Lymphocytes Relative: 19 % (ref 12.0–46.0)
MCHC: 34.1 g/dL (ref 30.0–36.0)
MCV: 89.7 fl (ref 78.0–100.0)
MONO ABS: 1.2 10*3/uL — AB (ref 0.1–1.0)
Monocytes Relative: 10.1 % (ref 3.0–12.0)
NEUTROS ABS: 8 10*3/uL — AB (ref 1.4–7.7)
NEUTROS PCT: 65.7 % (ref 43.0–77.0)
PLATELETS: 271 10*3/uL (ref 150.0–400.0)
RBC: 5.13 Mil/uL (ref 4.22–5.81)
RDW: 14 % (ref 11.5–15.5)
WBC: 12.2 10*3/uL — ABNORMAL HIGH (ref 4.0–10.5)

## 2017-08-02 LAB — BASIC METABOLIC PANEL
BUN: 17 mg/dL (ref 6–23)
CHLORIDE: 104 meq/L (ref 96–112)
CO2: 27 meq/L (ref 19–32)
CREATININE: 1.14 mg/dL (ref 0.40–1.50)
Calcium: 9.4 mg/dL (ref 8.4–10.5)
GFR: 69.17 mL/min (ref >60.00)
GLUCOSE: 88 mg/dL (ref 70–99)
POTASSIUM: 4.3 meq/L (ref 3.5–5.1)
Sodium: 140 mEq/L (ref 135–145)

## 2017-08-06 ENCOUNTER — Inpatient Hospital Stay: Admission: RE | Admit: 2017-08-06 | Payer: Self-pay | Source: Ambulatory Visit

## 2017-08-07 ENCOUNTER — Encounter: Payer: Self-pay | Admitting: Family Medicine

## 2017-08-07 NOTE — Telephone Encounter (Signed)
Spoke with patient.

## 2017-08-08 NOTE — Progress Notes (Signed)
  HPI:  Using dictation device. Unfortunately this device frequently misinterprets words/phrases.  Acute visit for:  Lower abd pain: -started ~ 4/7 and saw [facility] -had low grade temp, chills, dysuria, pain in midshaft penis- had UA negative twice, cbc with very mild leukocytosis, negative BMP, neg KUB -reports hx penile stricture and lodged stone in the past -symptoms subsided a few days ago when took flomax and none since -CT stone scan was not covered by his insurance/denied per notes -saw urologist tin the past and agreeable to re-evaluation  ROS: See pertinent positives and negatives per HPI.  Past Medical History:  Diagnosis Date  . Bereavement    lost son 2010  . Frequent headaches   . History of chicken pox   . History of kidney stones   . Hypertension   . Insomnia   . Migraine     History reviewed. No pertinent surgical history.  Family History  Problem Relation Age of Onset  . Arthritis Paternal Grandmother   . Alcoholism Daughter   . Congestive Heart Failure Mother   . Kidney disease Father   . Mental illness Son        suicide    SOCIAL HX:    Current Outpatient Medications:  .  lisinopril (PRINIVIL,ZESTRIL) 5 MG tablet, Take 1 tablet (5 mg total) by mouth daily., Disp: 90 tablet, Rfl: 3 .  zolpidem (AMBIEN) 5 MG tablet, Take 1 tablet (5 mg total) by mouth at bedtime as needed for sleep., Disp: 20 tablet, Rfl: 0  EXAM:  Vitals:   08/09/17 0859  BP: 122/64  Pulse: 82  Temp: 98.2 F (36.8 C)  SpO2: 96%    Body mass index is 28.59 kg/m.  GENERAL: vitals reviewed and listed above, alert, oriented, appears well hydrated and in no acute distress  HEENT: atraumatic, conjunttiva clear, no obvious abnormalities on inspection of external nose and ears  NECK: no obvious masses on inspection  LUNGS: clear to auscultation bilaterally, no wheezes, rales or rhonchi, good air movement  CV: HRRR, no peripheral edema  ABD: soft, NTTP  MS: moves all  extremities without noticeable abnormality  PSYCH: pleasant and cooperative, no obvious depression or anxiety  ASSESSMENT AND PLAN:  Discussed the following assessment and plan:  Penile pain  Nephrolithiasis  Personal history of urethral stricture  -I am glad he is feeling better, no symptoms today -unusual story and he is concerned and we opted to have him see urologist to eval, ? Recurrent stricture, stones, vs other -in interim if symptoms will check urine, micro, culture and treat with abx for possible urethritis - particularly if any systemic symptoms -he was upset about lack of response to phone calls after last visit, I will notify practice administrator -Patient advised to return or notify a doctor immediately if symptoms worsen or persist or new concerns arise.  Patient Instructions  I put in standing orders for urine studies should symptoms recur prior to seeing urology. Simply contact us for lab visit if this is needed. Also let me know via mychart.  -We placed a referral for you as discussed to the urologist. It usually takes about 1-2 weeks to process and schedule this referral. If you have not heard from us regarding this appointment in 2 weeks please contact our office.     Terressa KoyanagiHannah R Cruz Devilla, DO

## 2017-08-09 ENCOUNTER — Encounter: Payer: Self-pay | Admitting: Family Medicine

## 2017-08-09 ENCOUNTER — Ambulatory Visit: Payer: Managed Care, Other (non HMO) | Admitting: Family Medicine

## 2017-08-09 VITALS — BP 122/64 | HR 82 | Temp 98.2°F | Ht 71.25 in | Wt 206.4 lb

## 2017-08-09 DIAGNOSIS — Z87448 Personal history of other diseases of urinary system: Secondary | ICD-10-CM | POA: Diagnosis not present

## 2017-08-09 DIAGNOSIS — N200 Calculus of kidney: Secondary | ICD-10-CM | POA: Diagnosis not present

## 2017-08-09 DIAGNOSIS — N4889 Other specified disorders of penis: Secondary | ICD-10-CM

## 2017-08-09 NOTE — Patient Instructions (Signed)
I put in standing orders for urine studies should symptoms recur prior to seeing urology. Simply contact us for lab visit if this is needed. Also let me know via mychart.  -We placed a referral for you as discussed to the urologist. It usually takes about 1-2 weeks to process and schedule this referral. If you have not heard from us regarding this appointment in 2 weeks please contact our office.

## 2017-08-21 ENCOUNTER — Encounter: Payer: Self-pay | Admitting: Family Medicine

## 2017-09-20 NOTE — Progress Notes (Signed)
HPI:  Using dictation device. Unfortunately this device frequently misinterprets words/phrases. Due for lipid recheck (high 01/2017), hgba1c, CBC Saw urology, Dr. Ivette Loyal and had PSA and DRE. Sees dermatology for skin exams.  Here for CPE:  -Concerns and/or follow up today:   HLD/Hyperglycemia/OVerweight: -lifetyle recs advised  -considering statin  HTN: -meds: lisinopril  Frequent headaches/Migraines: -lisinopril helped  Insomnia: -more when travels, started remotely with loss of son -uses sleep aide rarely and with caution - needs refill -CBT and sleep hygeine advised  -Diet: variety of foods, balance and well rounded - reports has really changed diet the last 3 months -Exercise:  regular exercise -Diabetes and Dyslipidemia Screening: fasting for labs -Vaccines: UTD -wants STI testing, Hep C screening (if born 53-1965): no -FH colon or prstate ca: see FH - saw urology for prostate ca screening Last colon cancer screening: cologuard 05/2016  Last prostate ca screening: PSA 04/2016 and recently with urology -Alcohol, Tobacco, drug use: see social history  Review of Systems - no fevers, unintentional weight loss, vision loss, hearing loss, chest pain, sob, hemoptysis, melena, hematochezia, hematuria, genital discharge, changing or concerning skin lesions, bleeding, bruising, loc, thoughts of self harm or SI  Past Medical History:  Diagnosis Date  . Bereavement    lost son 2010  . Frequent headaches   . History of chicken pox   . History of kidney stones   . Hypertension   . Insomnia   . Migraine     History reviewed. No pertinent surgical history.  Family History  Problem Relation Age of Onset  . Arthritis Paternal Grandmother   . Alcoholism Daughter   . Congestive Heart Failure Mother   . Kidney disease Father   . Mental illness Son        suicide    Social History   Socioeconomic History  . Marital status: Married    Spouse name: Not on file  .  Number of children: Not on file  . Years of education: Not on file  . Highest education level: Not on file  Occupational History  . Not on file  Social Needs  . Financial resource strain: Not on file  . Food insecurity:    Worry: Not on file    Inability: Not on file  . Transportation needs:    Medical: Not on file    Non-medical: Not on file  Tobacco Use  . Smoking status: Never Smoker  . Smokeless tobacco: Never Used  Substance and Sexual Activity  . Alcohol use: Yes    Alcohol/week: 0.0 oz    Comment: 1 drink twice per month  . Drug use: Not on file  . Sexual activity: Not on file  Lifestyle  . Physical activity:    Days per week: Not on file    Minutes per session: Not on file  . Stress: Not on file  Relationships  . Social connections:    Talks on phone: Not on file    Gets together: Not on file    Attends religious service: Not on file    Active member of club or organization: Not on file    Attends meetings of clubs or organizations: Not on file    Relationship status: Not on file  Other Topics Concern  . Not on file  Social History Narrative   Updated 01/2015   Work or Arboriculturist - Architect      Home Situation: lives with wife and children - wife and children see me  Spiritual Beliefs: Christian - strong faith      Lifestyle: runs several miles per day, weights; healthy diet        Current Outpatient Medications:  .  lisinopril (PRINIVIL,ZESTRIL) 5 MG tablet, Take 1 tablet (5 mg total) by mouth daily., Disp: 90 tablet, Rfl: 3 .  zolpidem (AMBIEN) 5 MG tablet, Take 1 tablet (5 mg total) by mouth at bedtime as needed for sleep., Disp: 10 tablet, Rfl: 0  EXAM:  Vitals:   09/24/17 0843  BP: 98/60  Pulse: (!) 59  Temp: 97.9 F (36.6 C)  TempSrc: Oral  Weight: 197 lb 8 oz (89.6 kg)  Height: 5' 11.5" (1.816 m)    Estimated body mass index is 27.16 kg/m as calculated from the following:   Height as of this encounter: 5' 11.5" (1.816  m).   Weight as of this encounter: 197 lb 8 oz (89.6 kg).  GENERAL: vitals reviewed and listed below, alert, oriented, appears well hydrated and in no acute distress  HEENT: head atraumatic, PERRLA, normal appearance of eyes, ears, nose and mouth. moist mucus membranes.  NECK: supple, no masses or lymphadenopathy  LUNGS: clear to auscultation bilaterally, no rales, rhonchi or wheeze  CV: HRRR, no peripheral edema or cyanosis, normal pedal pulses  ABDOMEN: bowel sounds normal, soft, non tender to palpation, no masses, no rebound or guarding  GU/DRE - declined  SKIN: no rash or abnormal lesions  - declined full skin exam - does with urology  MS: normal gait, moves all extremities normally  NEURO: normal gait, speech and thought processing grossly intact, muscle tone grossly intact throughout  PSYCH: normal affect, pleasant and cooperative  ASSESSMENT AND PLAN:  Discussed the following assessment and plan:  PREVENTIVE EXAM: -Discussed and advised all [place] preventive services health task force level A and B recommendations for age, sex and risks. -Advised at least 150 minutes of exercise per week and a healthy diet with avoidance of (less then 1 serving per week) processed foods, white starches, red meat, fast foods and sweets and consisting of: * 5-9 servings of fresh fruits and vegetables (not corn or potatoes) *nuts and seeds, beans *olives and olive oil *lean meats such as fish and white chicken  *whole grains -labs, studies and vaccines per orders this encounter - Hemoglobin A1c  2. Screening for depression -see phq9  3. Insomnia, unspecified type -discussed treatment options  -he prefers to continue Ambien - discussed risks again, agrees to assume risks -uses rarely  4. Essential hypertension - Basic metabolic panel  5. Hyperlipidemia, unspecified hyperlipidemia type - Lipid panel  Patient Instructions  BEFORE YOU LEAVE: -labs -follow up: 4 months  We have  ordered labs or studies at this visit. It can take up to 1-2 weeks for results and processing. IF results require follow up or explanation, we will call you with instructions. Clinically stable results will be released to your [facility]. If you have not heard from [place] or cannot find your results in [facility] in 2 weeks please contact our office at [phone].  If you are not yet signed up for [facility], please consider signing up.   We recommend the following healthy lifestyle for LIFE: 1) Small portions. But, make sure to get regular (at least 3 per day), healthy meals and small healthy snacks if needed.  2) Eat a healthy clean diet.   TRY TO EAT: -at least 5-7 servings of low sugar, colorful, and nutrient rich vegetables per day (not corn, potatoes or bananas.) -  berries are the best choice if you wish to eat fruit (only eat small amounts if trying to reduce weight)  -lean meets (fish, white meat of chicken or [place]) -vegan proteins for some meals - beans or tofu, whole grains, nuts and seeds -Replace bad fats with good fats - good fats include: fish, nuts and seeds, canola oil, olive oil -small amounts of low fat or non fat dairy -small amounts of100 % whole grains - check the lables -drink plenty of water  AVOID: -SUGAR, sweets, anything with added sugar, corn syrup or sweeteners - must read labels as even foods advertised as "healthy" often are loaded with sugar -if you must have a sweetener, small amounts of stevia may be best -sweetened beverages and artificially sweetened beverages -simple starches (rice, bread, potatoes, pasta, chips, etc - small amounts of 100% whole grains are ok) -red meat, pork, butter -fried foods, fast food, processed food, excessive dairy, eggs and coconut.  3)Get at least 150 minutes of sweaty aerobic exercise per week.  4)Reduce stress - consider counseling, meditation and relaxation to balance other aspects of your life.  Preventive Care 40-64 Years,  Male Preventive care refers to lifestyle choices and visits with your health care provider that can promote health and wellness. What does preventive care include?  A yearly physical exam. This is also called an annual well check.  Dental exams once or twice a year.  Routine eye exams. Ask your health care provider how often you should have your eyes checked.  Personal lifestyle choices, including: ? Daily care of your teeth and gums. ? Regular physical activity. ? Eating a healthy diet. ? Avoiding tobacco and drug use. ? Limiting alcohol use. ? Practicing safe sex. What happens during an annual well check? The services and screenings done by your health care provider during your annual well check will depend on your age, overall health, lifestyle risk factors, and family history of disease. Counseling Your health care provider may ask you questions about your:  Alcohol use.  Tobacco use.  Drug use.  Emotional well-being.  Home and relationship well-being.  Sexual activity.  Eating habits.  Work and work Statistician.  Screening You may have the following tests or measurements:  Height, weight, and BMI.  Blood pressure.  Lipid and cholesterol levels. These may be checked every 5 years, or more frequently if you are over 27 years old.  Skin check.  Lung cancer screening. You may have this screening every year starting at age 53 if you have a 30-pack-year history of smoking and currently smoke or have quit within the past 15 years.  Fecal occult blood test (FOBT) of the stool. You may have this test every year starting at age 8.  Flexible sigmoidoscopy or colonoscopy. You may have a sigmoidoscopy every 5 years or a colonoscopy every 10 years starting at age 1.  Prostate cancer screening. Recommendations will vary depending on your family history and other risks.  Hepatitis C blood test.  Hepatitis B blood test.  Sexually transmitted disease (STD)  testing.  Diabetes screening. This is done by checking your blood sugar (glucose) after you have not eaten for a while (fasting). You may have this done every 1-3 years.  Discuss your test results, treatment options, and if necessary, the need for more tests with your health care provider. Vaccines Your health care provider may recommend certain vaccines, such as:  Influenza vaccine. This is recommended every year.  Tetanus, diphtheria, and acellular pertussis (Tdap, Td)  vaccine. You may need a Td booster every 10 years.  Varicella vaccine. You may need this if you have not been vaccinated.  Zoster vaccine. You may need this after age 34.  Measles, mumps, and rubella (MMR) vaccine. You may need at least one dose of MMR if you were born in 1957 or later. You may also need a second dose.  Pneumococcal 13-valent conjugate (PCV13) vaccine. You may need this if you have certain conditions and have not been vaccinated.  Pneumococcal polysaccharide (PPSV23) vaccine. You may need one or two doses if you smoke cigarettes or if you have certain conditions.  Meningococcal vaccine. You may need this if you have certain conditions.  Hepatitis A vaccine. You may need this if you have certain conditions or if you travel or work in places where you may be exposed to hepatitis A.  Hepatitis B vaccine. You may need this if you have certain conditions or if you travel or work in places where you may be exposed to hepatitis B.  Haemophilus influenzae type b (Hib) vaccine. You may need this if you have certain risk factors.  Talk to your health care provider about which screenings and vaccines you need and how often you need them. This information is not intended to replace advice given to you by your health care provider. Make sure you discuss any questions you have with your health care provider. Document Released: 05/07/2015 Document Revised: 12/29/2015 Document Reviewed: 02/09/2015 Elsevier  Interactive Patient Education  2018 [employer].          No follow-ups on file.   Lucretia Kern, DO

## 2017-09-21 ENCOUNTER — Telehealth: Payer: Self-pay | Admitting: Family Medicine

## 2017-09-21 NOTE — Telephone Encounter (Signed)
Copied from CRM [id number]. Topic: Inquiry >> Sep 21, 2017  3:29 PM Oneal Grout wrote: Reason for CRM: Patient had lab work in April, has CPE on Monday, wants to know if labs are needed again? Please advise

## 2017-09-23 NOTE — Telephone Encounter (Signed)
Just got this now. Should always let pt know if you receive a massage and I am not back in office prior to date they are asking about. Thanks. In terms of labs - if morning appt and CPE ask that they come fasting if not inconvenient. If inconvenient let them know we can always order labs at appt for a future date. Thanks.

## 2017-09-24 ENCOUNTER — Ambulatory Visit (INDEPENDENT_AMBULATORY_CARE_PROVIDER_SITE_OTHER): Payer: Managed Care, Other (non HMO) | Admitting: Family Medicine

## 2017-09-24 ENCOUNTER — Encounter: Payer: Self-pay | Admitting: Family Medicine

## 2017-09-24 VITALS — BP 98/60 | HR 59 | Temp 97.9°F | Ht 71.5 in | Wt 197.5 lb

## 2017-09-24 DIAGNOSIS — Z0000 Encounter for general adult medical examination without abnormal findings: Secondary | ICD-10-CM

## 2017-09-24 DIAGNOSIS — G4700 Insomnia, unspecified: Secondary | ICD-10-CM | POA: Diagnosis not present

## 2017-09-24 DIAGNOSIS — Z1331 Encounter for screening for depression: Secondary | ICD-10-CM

## 2017-09-24 DIAGNOSIS — I10 Essential (primary) hypertension: Secondary | ICD-10-CM

## 2017-09-24 DIAGNOSIS — E785 Hyperlipidemia, unspecified: Secondary | ICD-10-CM

## 2017-09-24 LAB — BASIC METABOLIC PANEL
BUN: 18 mg/dL (ref 6–23)
CO2: 28 meq/L (ref 19–32)
Calcium: 9.7 mg/dL (ref 8.4–10.5)
Chloride: 103 mEq/L (ref 96–112)
Creatinine, Ser: 1.08 mg/dL (ref 0.40–1.50)
GFR: 73.59 mL/min (ref >60.00)
GLUCOSE: 103 mg/dL — AB (ref 70–99)
POTASSIUM: 5.1 meq/L (ref 3.5–5.1)
SODIUM: 138 meq/L (ref 135–145)

## 2017-09-24 LAB — LIPID PANEL
Cholesterol: 184 mg/dL (ref 0–200)
HDL: 43.5 mg/dL (ref >39.00)
LDL Cholesterol: 125 mg/dL — ABNORMAL HIGH (ref 0–99)
NONHDL: 140.15
Total CHOL/HDL Ratio: 4
Triglycerides: 77 mg/dL (ref 0.0–149.0)
VLDL: 15.4 mg/dL (ref 0.0–40.0)

## 2017-09-24 LAB — HEMOGLOBIN A1C: HEMOGLOBIN A1C: 5.7 % (ref 4.6–6.5)

## 2017-09-24 MED ORDER — ZOLPIDEM TARTRATE 5 MG PO TABS: 5.0000 mg | ORAL_TABLET | Freq: Every evening | ORAL | 0 refills | Status: DC | PRN

## 2017-09-24 NOTE — Patient Instructions (Signed)
BEFORE YOU LEAVE: -labs -follow up: 4 months  We have ordered labs or studies at this visit. It can take up to 1-2 weeks for results and processing. IF results require follow up or explanation, we will call you with instructions. Clinically stable results will be released to your [facility]. If you have not heard from [place] or cannot find your results in [facility] in 2 weeks please contact our office at [phone].  If you are not yet signed up for [facility], please consider signing up.   We recommend the following healthy lifestyle for LIFE: 1) Small portions. But, make sure to get regular (at least 3 per day), healthy meals and small healthy snacks if needed.  2) Eat a healthy clean diet.   TRY TO EAT: -at least 5-7 servings of low sugar, colorful, and nutrient rich vegetables per day (not corn, potatoes or bananas.) -berries are the best choice if you wish to eat fruit (only eat small amounts if trying to reduce weight)  -lean meets (fish, white meat of chicken or [place]) -vegan proteins for some meals - beans or tofu, whole grains, nuts and seeds -Replace bad fats with good fats - good fats include: fish, nuts and seeds, canola oil, olive oil -small amounts of low fat or non fat dairy -small amounts of100 % whole grains - check the lables -drink plenty of water  AVOID: -SUGAR, sweets, anything with added sugar, corn syrup or sweeteners - must read labels as even foods advertised as "healthy" often are loaded with sugar -if you must have a sweetener, small amounts of stevia may be best -sweetened beverages and artificially sweetened beverages -simple starches (rice, bread, potatoes, pasta, chips, etc - small amounts of 100% whole grains are ok) -red meat, pork, butter -fried foods, fast food, processed food, excessive dairy, eggs and coconut.  3)Get at least 150 minutes of sweaty aerobic exercise per week.  4)Reduce stress - consider counseling, meditation and relaxation to balance other  aspects of your life.  Preventive Care 40-64 Years, Male Preventive care refers to lifestyle choices and visits with your health care provider that can promote health and wellness. What does preventive care include?  A yearly physical exam. This is also called an annual well check.  Dental exams once or twice a year.  Routine eye exams. Ask your health care provider how often you should have your eyes checked.  Personal lifestyle choices, including: ? Daily care of your teeth and gums. ? Regular physical activity. ? Eating a healthy diet. ? Avoiding tobacco and drug use. ? Limiting alcohol use. ? Practicing safe sex. What happens during an annual well check? The services and screenings done by your health care provider during your annual well check will depend on your age, overall health, lifestyle risk factors, and family history of disease. Counseling Your health care provider may ask you questions about your:  Alcohol use.  Tobacco use.  Drug use.  Emotional well-being.  Home and relationship well-being.  Sexual activity.  Eating habits.  Work and work Statistician.  Screening You may have the following tests or measurements:  Height, weight, and BMI.  Blood pressure.  Lipid and cholesterol levels. These may be checked every 5 years, or more frequently if you are over 28 years old.  Skin check.  Lung cancer screening. You may have this screening every year starting at age 35 if you have a 30-pack-year history of smoking and currently smoke or have quit within the past 15 years.  Fecal occult blood test (FOBT) of the stool. You may have this test every year starting at age 37.  Flexible sigmoidoscopy or colonoscopy. You may have a sigmoidoscopy every 5 years or a colonoscopy every 10 years starting at age 94.  Prostate cancer screening. Recommendations will vary depending on your family history and other risks.  Hepatitis C blood test.  Hepatitis B blood  test.  Sexually transmitted disease (STD) testing.  Diabetes screening. This is done by checking your blood sugar (glucose) after you have not eaten for a while (fasting). You may have this done every 1-3 years.  Discuss your test results, treatment options, and if necessary, the need for more tests with your health care provider. Vaccines Your health care provider may recommend certain vaccines, such as:  Influenza vaccine. This is recommended every year.  Tetanus, diphtheria, and acellular pertussis (Tdap, Td) vaccine. You may need a Td booster every 10 years.  Varicella vaccine. You may need this if you have not been vaccinated.  Zoster vaccine. You may need this after age 109.  Measles, mumps, and rubella (MMR) vaccine. You may need at least one dose of MMR if you were born in 1957 or later. You may also need a second dose.  Pneumococcal 13-valent conjugate (PCV13) vaccine. You may need this if you have certain conditions and have not been vaccinated.  Pneumococcal polysaccharide (PPSV23) vaccine. You may need one or two doses if you smoke cigarettes or if you have certain conditions.  Meningococcal vaccine. You may need this if you have certain conditions.  Hepatitis A vaccine. You may need this if you have certain conditions or if you travel or work in places where you may be exposed to hepatitis A.  Hepatitis B vaccine. You may need this if you have certain conditions or if you travel or work in places where you may be exposed to hepatitis B.  Haemophilus influenzae type b (Hib) vaccine. You may need this if you have certain risk factors.  Talk to your health care provider about which screenings and vaccines you need and how often you need them. This information is not intended to replace advice given to you by your health care provider. Make sure you discuss any questions you have with your health care provider. Document Released: 05/07/2015 Document Revised: 12/29/2015  Document Reviewed: 02/09/2015 Elsevier Interactive Patient Education  [employer].

## 2017-09-24 NOTE — Addendum Note (Signed)
Addended by: Terressa KoyanagiKIM, HANNAH R on: 09/24/2017 10:17 AM   Modules accepted: Level of Service

## 2017-09-24 NOTE — Telephone Encounter (Signed)
Patient informed of the message below at the office visit today.

## 2018-01-22 NOTE — Progress Notes (Signed)
HPI:  Using dictation device. Unfortunately this device frequently misinterprets words/phrases.  Melvin Faulkner is a pleasant 62 y.o. here for follow up. Chronic medical problems summarized below were reviewed for changes and stability and were updated as needed below. These issues and their treatment remain stable for the most part.  Is been running and trying to eat healthy and feels good for the most part.  Blood pressure is on the low end, but he has not had any dizziness or lightheaded feelings when he stands up denies CP, SOB, DOE, treatment intolerance or new symptoms. He has a new complaint of a sore throat today.  This started last night.  No fevers, shortness of breath, significant respiratory symptoms.  He does tend to get some allergy symptoms this time of the year   HLD/Hyperglycemia/OVerweight: -lifetyle recs advised  -considering statin  HTN: -meds: lisinopril  Frequent headaches/Migraines: -lisinopril helped  Insomnia: -more when travels, started remotely with loss of son -uses sleep aide rarely and with caution - needs refill -CBT and sleep hygeine advised   ROS: See pertinent positives and negatives per HPI.  Past Medical History:  Diagnosis Date  . Bereavement    lost son 2010  . Frequent headaches   . History of chicken pox   . History of kidney stones   . Hypertension   . Insomnia   . Migraine     History reviewed. No pertinent surgical history.  Family History  Problem Relation Age of Onset  . Arthritis Paternal Grandmother   . Alcoholism Daughter   . Congestive Heart Failure Mother   . Kidney disease Father   . Mental illness Son        suicide    SOCIAL HX: See HPI    Current Outpatient Medications:  .  lisinopril (PRINIVIL,ZESTRIL) 5 MG tablet, Take 1 tablet (5 mg total) by mouth daily., Disp: 90 tablet, Rfl: 3 .  zolpidem (AMBIEN) 5 MG tablet, Take 1 tablet (5 mg total) by mouth at bedtime as needed for sleep., Disp: 10  tablet, Rfl: 0  EXAM:  Vitals:   01/24/18 0923  BP: 98/70  Pulse: 64  Temp: 98.5 F (36.9 C)    Body mass index is 25.94 kg/m.  GENERAL: vitals reviewed and listed above, alert, oriented, appears well hydrated and in no acute distress  HEENT: atraumatic, conjunttiva clear, no obvious abnormalities on inspection of external nose and ears, normal appearance of ear canals and TMs, clear nasal congestion, mild post oropharyngeal erythema with PND, no tonsillar edema or exudate, no sinus TTP  NECK: no obvious masses on inspection  LUNGS: clear to auscultation bilaterally, no wheezes, rales or rhonchi, good air movement  CV: HRRR, no peripheral edema  MS: moves all extremities without noticeable abnormality  PSYCH: pleasant and cooperative, no obvious depression or anxiety  ASSESSMENT AND PLAN:  Discussed the following assessment and plan:  Sore throat - Plan: POC Rapid Strep A  Essential hypertension  Hyperlipidemia, unspecified hyperlipidemia type  -Did a rapid strep test of the redness in his throat and new onset very sore throat, and this was negative, suspect viral respiratory illness versus allergies -symptomatic care -Labs next visit, these were looking good as last visit -Discussed his blood pressure systolic blood pressure being on the lower end, will monitor for any symptoms and lower his dose of lisinopril occur -Continue healthy lifestyle -Follow-up 3 to 4 months with labs at that visit -Patient advised to return or notify a doctor immediately if  symptoms worsen or persist or new concerns arise.  Patient Instructions  BEFORE YOU LEAVE: -rapid strep - he can leave if he wishes to not wait on results -follow up: 3-4 months - will plan to do labs then   We recommend the following healthy lifestyle for LIFE: 1) Small portions. But, make sure to get regular (at least 3 per day), healthy meals and small healthy snacks if needed.  2) Eat a healthy clean  diet.   TRY TO EAT: -at least 5-7 servings of low sugar, colorful, and nutrient rich vegetables per day (not corn, potatoes or bananas.) -berries are the best choice if you wish to eat fruit (only eat small amounts if trying to reduce weight)  -lean meets (fish, white meat of chicken or [place]) -vegan proteins for some meals - beans or tofu, whole grains, nuts and seeds -Replace bad fats with good fats - good fats include: fish, nuts and seeds, canola oil, olive oil -small amounts of low fat or non fat dairy -small amounts of100 % whole grains - check the lables -drink plenty of water  AVOID: -SUGAR, sweets, anything with added sugar, corn syrup or sweeteners - must read labels as even foods advertised as "healthy" often are loaded with sugar -if you must have a sweetener, small amounts of stevia may be best -sweetened beverages and artificially sweetened beverages -simple starches (rice, bread, potatoes, pasta, chips, etc - small amounts of 100% whole grains are ok) -red meat, pork, butter -fried foods, fast food, processed food, excessive dairy, eggs and coconut.  3)Get at least 150 minutes of sweaty aerobic exercise per week.  4)Reduce stress - consider counseling, meditation and relaxation to balance other aspects of your life.     Terressa Koyanagi, DO

## 2018-01-24 ENCOUNTER — Telehealth: Payer: Self-pay | Admitting: *Deleted

## 2018-01-24 ENCOUNTER — Ambulatory Visit: Payer: Managed Care, Other (non HMO) | Admitting: Family Medicine

## 2018-01-24 ENCOUNTER — Encounter: Payer: Self-pay | Admitting: Family Medicine

## 2018-01-24 VITALS — BP 98/70 | HR 64 | Temp 98.5°F | Ht 71.5 in | Wt 188.6 lb

## 2018-01-24 DIAGNOSIS — I10 Essential (primary) hypertension: Secondary | ICD-10-CM | POA: Diagnosis not present

## 2018-01-24 DIAGNOSIS — E785 Hyperlipidemia, unspecified: Secondary | ICD-10-CM | POA: Diagnosis not present

## 2018-01-24 DIAGNOSIS — J029 Acute pharyngitis, unspecified: Secondary | ICD-10-CM

## 2018-01-24 LAB — POCT RAPID STREP A (OFFICE): RAPID STREP A SCREEN: NEGATIVE

## 2018-01-24 MED ORDER — ZOLPIDEM TARTRATE 5 MG PO TABS: 5.0000 mg | ORAL_TABLET | Freq: Every evening | ORAL | 0 refills | Status: DC | PRN

## 2018-01-24 NOTE — Telephone Encounter (Signed)
Okay to refill once.  He is aware of risks.

## 2018-01-24 NOTE — Patient Instructions (Signed)
BEFORE YOU LEAVE: -rapid strep - he can leave if he wishes to not wait on results -follow up: 3-4 months - will plan to do labs then   We recommend the following healthy lifestyle for LIFE: 1) Small portions. But, make sure to get regular (at least 3 per day), healthy meals and small healthy snacks if needed.  2) Eat a healthy clean diet.   TRY TO EAT: -at least 5-7 servings of low sugar, colorful, and nutrient rich vegetables per day (not corn, potatoes or bananas.) -berries are the best choice if you wish to eat fruit (only eat small amounts if trying to reduce weight)  -lean meets (fish, white meat of chicken or [place]) -vegan proteins for some meals - beans or tofu, whole grains, nuts and seeds -Replace bad fats with good fats - good fats include: fish, nuts and seeds, canola oil, olive oil -small amounts of low fat or non fat dairy -small amounts of100 % whole grains - check the lables -drink plenty of water  AVOID: -SUGAR, sweets, anything with added sugar, corn syrup or sweeteners - must read labels as even foods advertised as "healthy" often are loaded with sugar -if you must have a sweetener, small amounts of stevia may be best -sweetened beverages and artificially sweetened beverages -simple starches (rice, bread, potatoes, pasta, chips, etc - small amounts of 100% whole grains are ok) -red meat, pork, butter -fried foods, fast food, processed food, excessive dairy, eggs and coconut.  3)Get at least 150 minutes of sweaty aerobic exercise per week.  4)Reduce stress - consider counseling, meditation and relaxation to balance other aspects of your life.

## 2018-01-24 NOTE — Telephone Encounter (Signed)
I called the pt and informed him the rapid strep test was negative.  He stated he forgot to mention he needs a refill on Ambien 5mg  to be sent to [facility].  Message sent to Dr Selena Batten.

## 2018-01-24 NOTE — Telephone Encounter (Signed)
Rx done. 

## 2018-06-04 ENCOUNTER — Other Ambulatory Visit: Payer: Self-pay | Admitting: *Deleted

## 2018-06-04 MED ORDER — LISINOPRIL 5 MG PO TABS: 5.0000 mg | ORAL_TABLET | Freq: Every day | ORAL | 0 refills | Status: DC

## 2018-06-04 NOTE — Telephone Encounter (Signed)
Rx done. 

## 2018-09-04 ENCOUNTER — Other Ambulatory Visit: Payer: Self-pay | Admitting: Family Medicine

## 2018-09-05 ENCOUNTER — Other Ambulatory Visit: Payer: Self-pay

## 2018-09-05 ENCOUNTER — Ambulatory Visit (INDEPENDENT_AMBULATORY_CARE_PROVIDER_SITE_OTHER): Payer: Self-pay | Admitting: Family Medicine

## 2018-09-05 DIAGNOSIS — E785 Hyperlipidemia, unspecified: Secondary | ICD-10-CM

## 2018-09-05 DIAGNOSIS — Z566 Other physical and mental strain related to work: Secondary | ICD-10-CM

## 2018-09-05 DIAGNOSIS — I10 Essential (primary) hypertension: Secondary | ICD-10-CM

## 2018-09-05 DIAGNOSIS — G4700 Insomnia, unspecified: Secondary | ICD-10-CM

## 2018-09-05 MED ORDER — ZOLPIDEM TARTRATE 5 MG PO TABS: 5.0000 mg | ORAL_TABLET | Freq: Every evening | ORAL | 0 refills | Status: DC | PRN

## 2018-09-05 NOTE — Progress Notes (Signed)
Virtual Visit via Video Note  I connected with Melvin Faulkner  on 09/05/18 at  3:00 PM EDT by a video enabled telemedicine application and verified that I am speaking with the correct person using two identifiers.  Location patient: home Location provider:work or home office Persons participating in the virtual visit: patient, provider   HPI:  Melvin Faulkner is a pleasant 63 y.o. here for follow up. Chronic medical problems summarized below were reviewed for changes and stability and were updated as needed below. These issues and their treatment remain stable for the most part.  Still exercising. Taking lisinopril daily. More stress then usually. Building new house. Has had tons of work as a Surveyor, minerals during the pandemic. Not using a mask. No signs or symptoms of COVID19. Has used 2.5 mg of ambien to help with sleep from time to time and feels is very helpful. Uses rarely and aware of risks. Has tried CBT for sleep and did not find it useful..  No reported CP, SOB, DOE, treatment intolerance or new symptoms.  HLD/Hyperglycemia/OVerweight: -lifetyle recs advised  -considering statin  HTN: -meds: lisinopril  Frequent headaches/Migraines: -lisinopril helped  Insomnia: -more when travels, startedremotely with lossof son -uses sleep aide rarely and with caution- needs refill -CBT and sleep hygeine advised  ROS: See pertinent positives and negatives per HPI.  Past Medical History:  Diagnosis Date  . Bereavement    lost son 2010  . Frequent headaches   . History of chicken pox   . History of kidney stones   . Hypertension   . Insomnia   . Migraine     No past surgical history on file.  Family History  Problem Relation Age of Onset  . Arthritis Paternal Grandmother   . Alcoholism Daughter   . Congestive Heart Failure Mother   . Kidney disease Father   . Mental illness Son        suicide    SOCIAL HX: see hpi   Current Outpatient Medications:  .  lisinopril  (ZESTRIL) 5 MG tablet, TAKE 1 TABLET(5 MG) BY MOUTH DAILY, Disp: 90 tablet, Rfl: 0 .  zolpidem (AMBIEN) 5 MG tablet, Take 1 tablet (5 mg total) by mouth at bedtime as needed for sleep., Disp: 10 tablet, Rfl: 0  EXAM:  VITALS per patient if applicable: none  GENERAL: alert, oriented, appears well and in no acute distress  HEENT: atraumatic, conjunttiva clear, no obvious abnormalities on inspection of external nose and ears  NECK: normal movements of the head and neck  LUNGS: on inspection no signs of respiratory distress, breathing rate appears normal, no obvious gross SOB, gasping or wheezing  CV: no obvious cyanosis  MS: moves all visible extremities without noticeable abnormality  PSYCH/NEURO: pleasant and cooperative, no obvious depression or anxiety, speech and thought processing grossly intact  ASSESSMENT AND PLAN:  Discussed the following assessment and plan:  Essential hypertension  Stress at work  Insomnia, unspecified type  Hyperlipidemia, unspecified hyperlipidemia type  Declined in person visit and labs at this time. In light of the pandemic prefers to wait until the fall. Advised continued exercise, current medications refilled as needed. CPE in the fall with Dr. Hassan Rowan. Also discussed strategies to reduce his chance of acquiring COVID19 including masking.   I discussed the assessment and treatment plan with the patient. The patient was provided an opportunity to ask questions and all were answered. The patient agreed with the plan and demonstrated an understanding of the instructions.   The patient  was advised to call back or seek an in-person evaluation if the symptoms worsen or if the condition fails to improve as anticipated.   Follow up instructions: Advised assistant Ronnald CollumJo Anne to help patient arrange the following: -CPE with Dr. Hassan RowanKoberlein in 4 months  Terressa KoyanagiHannah R Jarika Robben, DO

## 2018-09-06 ENCOUNTER — Telehealth: Payer: Self-pay | Admitting: *Deleted

## 2018-09-06 NOTE — Telephone Encounter (Signed)
I called the pt and scheduled an appt for 10/21 per pts preference.  Patient also stated his pharmacy does not have the Rx for Ambien that was sent yesterday.  It appears the Rx was phoned in and I called [facility] and left a voicemail with the Rx as stated.

## 2018-09-06 NOTE — Telephone Encounter (Signed)
-----   Message from Terressa Koyanagi, DO sent at 09/05/2018  4:25 PM EDT ----- -CPE with Dr. Hassan Rowan in 4 months

## 2018-12-02 DIAGNOSIS — D225 Melanocytic nevi of trunk: Secondary | ICD-10-CM | POA: Diagnosis not present

## 2018-12-02 DIAGNOSIS — D2271 Melanocytic nevi of right lower limb, including hip: Secondary | ICD-10-CM | POA: Diagnosis not present

## 2018-12-02 DIAGNOSIS — L570 Actinic keratosis: Secondary | ICD-10-CM | POA: Diagnosis not present

## 2018-12-02 DIAGNOSIS — L821 Other seborrheic keratosis: Secondary | ICD-10-CM | POA: Diagnosis not present

## 2018-12-08 ENCOUNTER — Other Ambulatory Visit: Payer: Self-pay | Admitting: Family Medicine

## 2019-02-12 ENCOUNTER — Encounter: Payer: Self-pay | Admitting: Family Medicine

## 2019-03-06 ENCOUNTER — Other Ambulatory Visit: Payer: Self-pay | Admitting: Family Medicine

## 2019-04-09 ENCOUNTER — Telehealth: Payer: Self-pay | Admitting: *Deleted

## 2019-04-09 NOTE — Telephone Encounter (Signed)
Patient was seen by Tommi Rumps on 4/9-see results note.

## 2019-04-09 NOTE — Telephone Encounter (Signed)
-----   Message from Lucretia Kern, DO sent at 12/18/2017 12:52 PM EDT ----- Please check to see if any of these overdue results were not done or if then need to be reorderd or rescheduled.  ----- Message ----- From: SYSTEM Sent: 12/14/2017  12:07 AM EDT To: Lucretia Kern, DO

## 2019-05-20 ENCOUNTER — Other Ambulatory Visit: Payer: Self-pay | Admitting: Family Medicine

## 2019-05-20 ENCOUNTER — Telehealth: Payer: Self-pay | Admitting: Family Medicine

## 2019-05-20 DIAGNOSIS — Z125 Encounter for screening for malignant neoplasm of prostate: Secondary | ICD-10-CM

## 2019-05-20 DIAGNOSIS — Z131 Encounter for screening for diabetes mellitus: Secondary | ICD-10-CM

## 2019-05-20 DIAGNOSIS — E785 Hyperlipidemia, unspecified: Secondary | ICD-10-CM

## 2019-05-20 DIAGNOSIS — I10 Essential (primary) hypertension: Secondary | ICD-10-CM

## 2019-05-20 NOTE — Telephone Encounter (Signed)
Pt would like to change his appt on 05/23/19 to an virtual. I see that it is a physical. Pt said he is willing to come in on wed morning or Thursday morning to do the lab work for the cpe. Informed pt that Koberlein would have to place lab orders in order for [place] to schedule a lab appt. Pt can be contacted at [phone]

## 2019-05-20 NOTE — Telephone Encounter (Signed)
Spoke with the pt and informed him of the message below.  Patient stated he will come in fasting on Friday for the appt as there are no openings for labs prior to this appt.  Message sent to Dr Hassan Rowan as Lorain Childes.

## 2019-05-20 NOTE — Telephone Encounter (Signed)
I put in bloodwork orders. I cannot do a virtual physical. I have never seen him before, so if he would like to do a virtual transfer of care visit we can do this and then set up a physical for later? But, I am happy to postpone the physical to a later date after he can complete bloodwork if that is preferable to him.

## 2019-05-20 NOTE — Telephone Encounter (Signed)
noted 

## 2019-05-22 ENCOUNTER — Other Ambulatory Visit: Payer: Self-pay

## 2019-05-23 ENCOUNTER — Encounter: Payer: Self-pay | Admitting: Family Medicine

## 2019-05-23 ENCOUNTER — Ambulatory Visit (INDEPENDENT_AMBULATORY_CARE_PROVIDER_SITE_OTHER): Payer: BLUE CROSS/BLUE SHIELD | Admitting: Family Medicine

## 2019-05-23 VITALS — BP 120/72 | HR 65 | Temp 97.9°F | Ht 72.0 in | Wt 205.0 lb

## 2019-05-23 DIAGNOSIS — Z1211 Encounter for screening for malignant neoplasm of colon: Secondary | ICD-10-CM | POA: Diagnosis not present

## 2019-05-23 DIAGNOSIS — R350 Frequency of micturition: Secondary | ICD-10-CM

## 2019-05-23 DIAGNOSIS — Z0000 Encounter for general adult medical examination without abnormal findings: Secondary | ICD-10-CM

## 2019-05-23 DIAGNOSIS — N401 Enlarged prostate with lower urinary tract symptoms: Secondary | ICD-10-CM | POA: Diagnosis not present

## 2019-05-23 MED ORDER — LISINOPRIL 5 MG PO TABS: ORAL_TABLET | ORAL | 1 refills | Status: DC

## 2019-05-23 MED ORDER — ZOLPIDEM TARTRATE 5 MG PO TABS: 5.0000 mg | ORAL_TABLET | Freq: Every evening | ORAL | 1 refills | Status: DC | PRN

## 2019-05-23 NOTE — Progress Notes (Signed)
Melvin Faulkner DOB: 1955/07/01 Encounter date: 05/23/2019  This is a 64 y.o. male who presents for complete physical   History of present illness/Additional concerns:  Followed with urology. Hasn't seen them regularly. Follows with dermatology - sees them yearly - Dr. Doreen Beam.   Hyperlipidemia: Not on medication.  Due for blood work.    Hypertension: On lisinopril. Doesn't check pressures at home at all. States that he can feel when it is out of whack, but rarely feels this.   Insomnia:still some difficulty with sleeping. Would like to get refill on the [place].   Cologuard negative from 05/2016.   Wakes 4 times/night to urinate. Tried medication for bph but it affected sexual drive.    Past Medical History:  Diagnosis Date  . Bereavement    lost son 2010  . Frequent headaches   . History of chicken pox   . History of kidney stones   . Hypertension   . Insomnia   . Migraine    History reviewed. No pertinent surgical history. No Known Allergies Current Meds  Medication Sig  . lisinopril (ZESTRIL) 5 MG tablet TAKE 1 TABLET(5 MG) BY MOUTH DAILY  . zolpidem (AMBIEN) 5 MG tablet Take 1 tablet (5 mg total) by mouth at bedtime as needed for sleep.  . [DISCONTINUED] lisinopril (ZESTRIL) 5 MG tablet TAKE 1 TABLET(5 MG) BY MOUTH DAILY  . [DISCONTINUED] zolpidem (AMBIEN) 5 MG tablet Take 1 tablet (5 mg total) by mouth at bedtime as needed for sleep.   Social History   Tobacco Use  . Smoking status: Never Smoker  . Smokeless tobacco: Never Used  Substance Use Topics  . Alcohol use: Yes    Alcohol/week: 0.0 standard drinks    Comment: 1 drink twice per month   Family History  Problem Relation Age of Onset  . Arthritis Paternal Grandmother        ?rheumatoid/bedridden  . Alzheimer's disease Paternal Grandmother   . Alcoholism Daughter   . Congestive Heart Failure Mother   . Lung disease Mother        recurrent pneumonia/resp issues  . Kidney disease Father   .  Alzheimer's disease Father 71  . Mental illness Son        suicide  . Healthy Sister   . Healthy Sister      Review of Systems  Constitutional: Negative for activity change, appetite change, chills, fatigue, fever and unexpected weight change.  HENT: Negative for congestion, ear pain, hearing loss, sinus pressure, sinus pain, sore throat and trouble swallowing.   Eyes: Negative for pain and visual disturbance.  Respiratory: Negative for cough, chest tightness, shortness of breath and wheezing.   Cardiovascular: Negative for chest pain, palpitations and leg swelling.  Gastrointestinal: Negative for abdominal distention, abdominal pain, blood in stool, constipation, diarrhea, nausea and vomiting.  Genitourinary: Positive for difficulty urinating (sometimes feels not emptying completely). Negative for decreased urine volume, dysuria, penile pain and testicular pain.       4 times urination per night   Musculoskeletal: Negative for arthralgias, back pain and joint swelling.  Skin: Negative for rash.  Neurological: Negative for dizziness, weakness, numbness and headaches.  Hematological: Negative for adenopathy. Does not bruise/bleed easily.  Psychiatric/Behavioral: Negative for agitation, sleep disturbance and suicidal ideas. The patient is not nervous/anxious.     CBC:  Lab Results  Component Value Date   WBC 12.2 (H) 08/01/2017   HGB 15.7 08/01/2017   HCT 46.0 08/01/2017   MCHC 34.1 08/01/2017  RDW 14.0 08/01/2017   PLT 271.0 08/01/2017   CMP: Lab Results  Component Value Date   NA 138 09/24/2017   K 5.1 09/24/2017   CL 103 09/24/2017   CO2 28 09/24/2017   GLUCOSE 103 (H) 09/24/2017   BUN 18 09/24/2017   CREATININE 1.08 09/24/2017   CALCIUM 9.7 09/24/2017   LIPID: Lab Results  Component Value Date   CHOL 184 09/24/2017   TRIG 77.0 09/24/2017   HDL 43.50 09/24/2017   LDLCALC 125 (H) 09/24/2017    Objective:  BP 120/72 (BP Location: Right Arm, Patient Position:  Sitting, Cuff Size: Normal)   Pulse 65   Temp 97.9 F (36.6 C) (Temporal)   Ht 6' (1.829 m)   Wt 205 lb (93 kg)   SpO2 97%   BMI 27.80 kg/m   Weight: 205 lb (93 kg)   BP Readings from Last 3 Encounters:  05/23/19 120/72  01/24/18 98/70  09/24/17 98/60   Wt Readings from Last 3 Encounters:  05/23/19 205 lb (93 kg)  01/24/18 188 lb 9.6 oz (85.5 kg)  09/24/17 197 lb 8 oz (89.6 kg)    Physical Exam Constitutional:      General: He is not in acute distress.    Appearance: He is well-developed.  HENT:     Head: Normocephalic and atraumatic.     Right Ear: External ear normal.     Left Ear: External ear normal.     Nose: Nose normal.     Mouth/Throat:     Pharynx: No oropharyngeal exudate.  Eyes:     Conjunctiva/sclera: Conjunctivae normal.     Pupils: Pupils are equal, round, and reactive to light.  Neck:     Thyroid: No thyromegaly.  Cardiovascular:     Rate and Rhythm: Normal rate and regular rhythm.     Heart sounds: Normal heart sounds. No murmur. No friction rub. No gallop.   Pulmonary:     Effort: Pulmonary effort is normal. No respiratory distress.     Breath sounds: Normal breath sounds. No stridor. No wheezing or rales.  Abdominal:     General: Bowel sounds are normal.     Palpations: Abdomen is soft.  Musculoskeletal:        General: Normal range of motion.     Cervical back: Neck supple.  Skin:    General: Skin is warm and dry.  Neurological:     Mental Status: He is alert and oriented to person, place, and time.  Psychiatric:        Behavior: Behavior normal.        Thought Content: Thought content normal.        Judgment: Judgment normal.     Assessment/Plan: There are no preventive care reminders to display for this patient. Health Maintenance reviewed.  1. Preventative health care Keep up with exercise and healthy eating.   2. Colon cancer screening - Cologuard  3. BPH: doesn't want to be on medications if possible. Monitor sx and if  worsening will consider.   Return for bloodwork when able.  Micheline Rough, MD  Will set up bloodwork; prefers to follow up just yearly if possible.

## 2019-06-05 ENCOUNTER — Other Ambulatory Visit: Payer: Self-pay

## 2019-06-05 ENCOUNTER — Other Ambulatory Visit (INDEPENDENT_AMBULATORY_CARE_PROVIDER_SITE_OTHER): Payer: BLUE CROSS/BLUE SHIELD

## 2019-06-05 DIAGNOSIS — I10 Essential (primary) hypertension: Secondary | ICD-10-CM | POA: Diagnosis not present

## 2019-06-05 DIAGNOSIS — Z131 Encounter for screening for diabetes mellitus: Secondary | ICD-10-CM

## 2019-06-05 DIAGNOSIS — E785 Hyperlipidemia, unspecified: Secondary | ICD-10-CM | POA: Diagnosis not present

## 2019-06-05 DIAGNOSIS — Z125 Encounter for screening for malignant neoplasm of prostate: Secondary | ICD-10-CM

## 2019-06-05 LAB — CBC WITH DIFFERENTIAL/PLATELET
Basophils Absolute: 0.1 10*3/uL (ref 0.0–0.1)
Basophils Relative: 0.8 % (ref 0.0–3.0)
Eosinophils Absolute: 0.6 10*3/uL (ref 0.0–0.7)
Eosinophils Relative: 8.3 % — ABNORMAL HIGH (ref 0.0–5.0)
HCT: 49.6 % (ref 39.0–52.0)
Hemoglobin: 16.8 g/dL (ref 13.0–17.0)
Lymphocytes Relative: 27.5 % (ref 12.0–46.0)
Lymphs Abs: 2.1 10*3/uL (ref 0.7–4.0)
MCHC: 33.8 g/dL (ref 30.0–36.0)
MCV: 88.9 fl (ref 78.0–100.0)
Monocytes Absolute: 0.5 10*3/uL (ref 0.1–1.0)
Monocytes Relative: 6.6 % (ref 3.0–12.0)
Neutro Abs: 4.4 10*3/uL (ref 1.4–7.7)
Neutrophils Relative %: 56.8 % (ref 43.0–77.0)
Platelets: 278 10*3/uL (ref 150.0–400.0)
RBC: 5.58 Mil/uL (ref 4.22–5.81)
RDW: 14.1 % (ref 11.5–15.5)
WBC: 7.8 10*3/uL (ref 4.0–10.5)

## 2019-06-05 LAB — LIPID PANEL
Cholesterol: 220 mg/dL — ABNORMAL HIGH (ref 0–200)
HDL: 47.5 mg/dL (ref >39.00)
LDL Cholesterol: 156 mg/dL — ABNORMAL HIGH (ref 0–99)
NonHDL: 172.58
Total CHOL/HDL Ratio: 5
Triglycerides: 85 mg/dL (ref 0.0–149.0)
VLDL: 17 mg/dL (ref 0.0–40.0)

## 2019-06-05 LAB — COMPREHENSIVE METABOLIC PANEL
ALT: 27 U/L (ref 0–53)
AST: 21 U/L (ref 0–37)
Albumin: 4.3 g/dL (ref 3.5–5.2)
Alkaline Phosphatase: 81 U/L (ref 39–117)
BUN: 16 mg/dL (ref 6–23)
CO2: 27 mEq/L (ref 19–32)
Calcium: 9.7 mg/dL (ref 8.4–10.5)
Chloride: 102 mEq/L (ref 96–112)
Creatinine, Ser: 1.06 mg/dL (ref 0.40–1.50)
GFR: 70.36 mL/min (ref >60.00)
Glucose, Bld: 105 mg/dL — ABNORMAL HIGH (ref 70–99)
Potassium: 4.6 mEq/L (ref 3.5–5.1)
Sodium: 138 mEq/L (ref 135–145)
Total Bilirubin: 0.8 mg/dL (ref 0.2–1.2)
Total Protein: 7.3 g/dL (ref 6.0–8.3)

## 2019-06-05 LAB — TSH: TSH: 2.33 u[IU]/mL (ref 0.35–4.50)

## 2019-06-05 LAB — PSA: PSA: 2.94 ng/mL (ref 0.10–4.00)

## 2019-06-05 LAB — HEMOGLOBIN A1C: Hgb A1c MFr Bld: 5.8 % (ref 4.6–6.5)

## 2019-06-10 ENCOUNTER — Other Ambulatory Visit: Payer: Self-pay | Admitting: Family Medicine

## 2019-06-20 IMAGING — DX DG ABDOMEN 1V
1 series · 1 of 1 positions shown · non-contrast
Comparison: None in PACs

CLINICAL DATA: Difficulty urinating, lower pelvic pain for the past
2 days. History of kidney stones.

EXAM:
ABDOMEN - 1 VIEW

[kub ap]
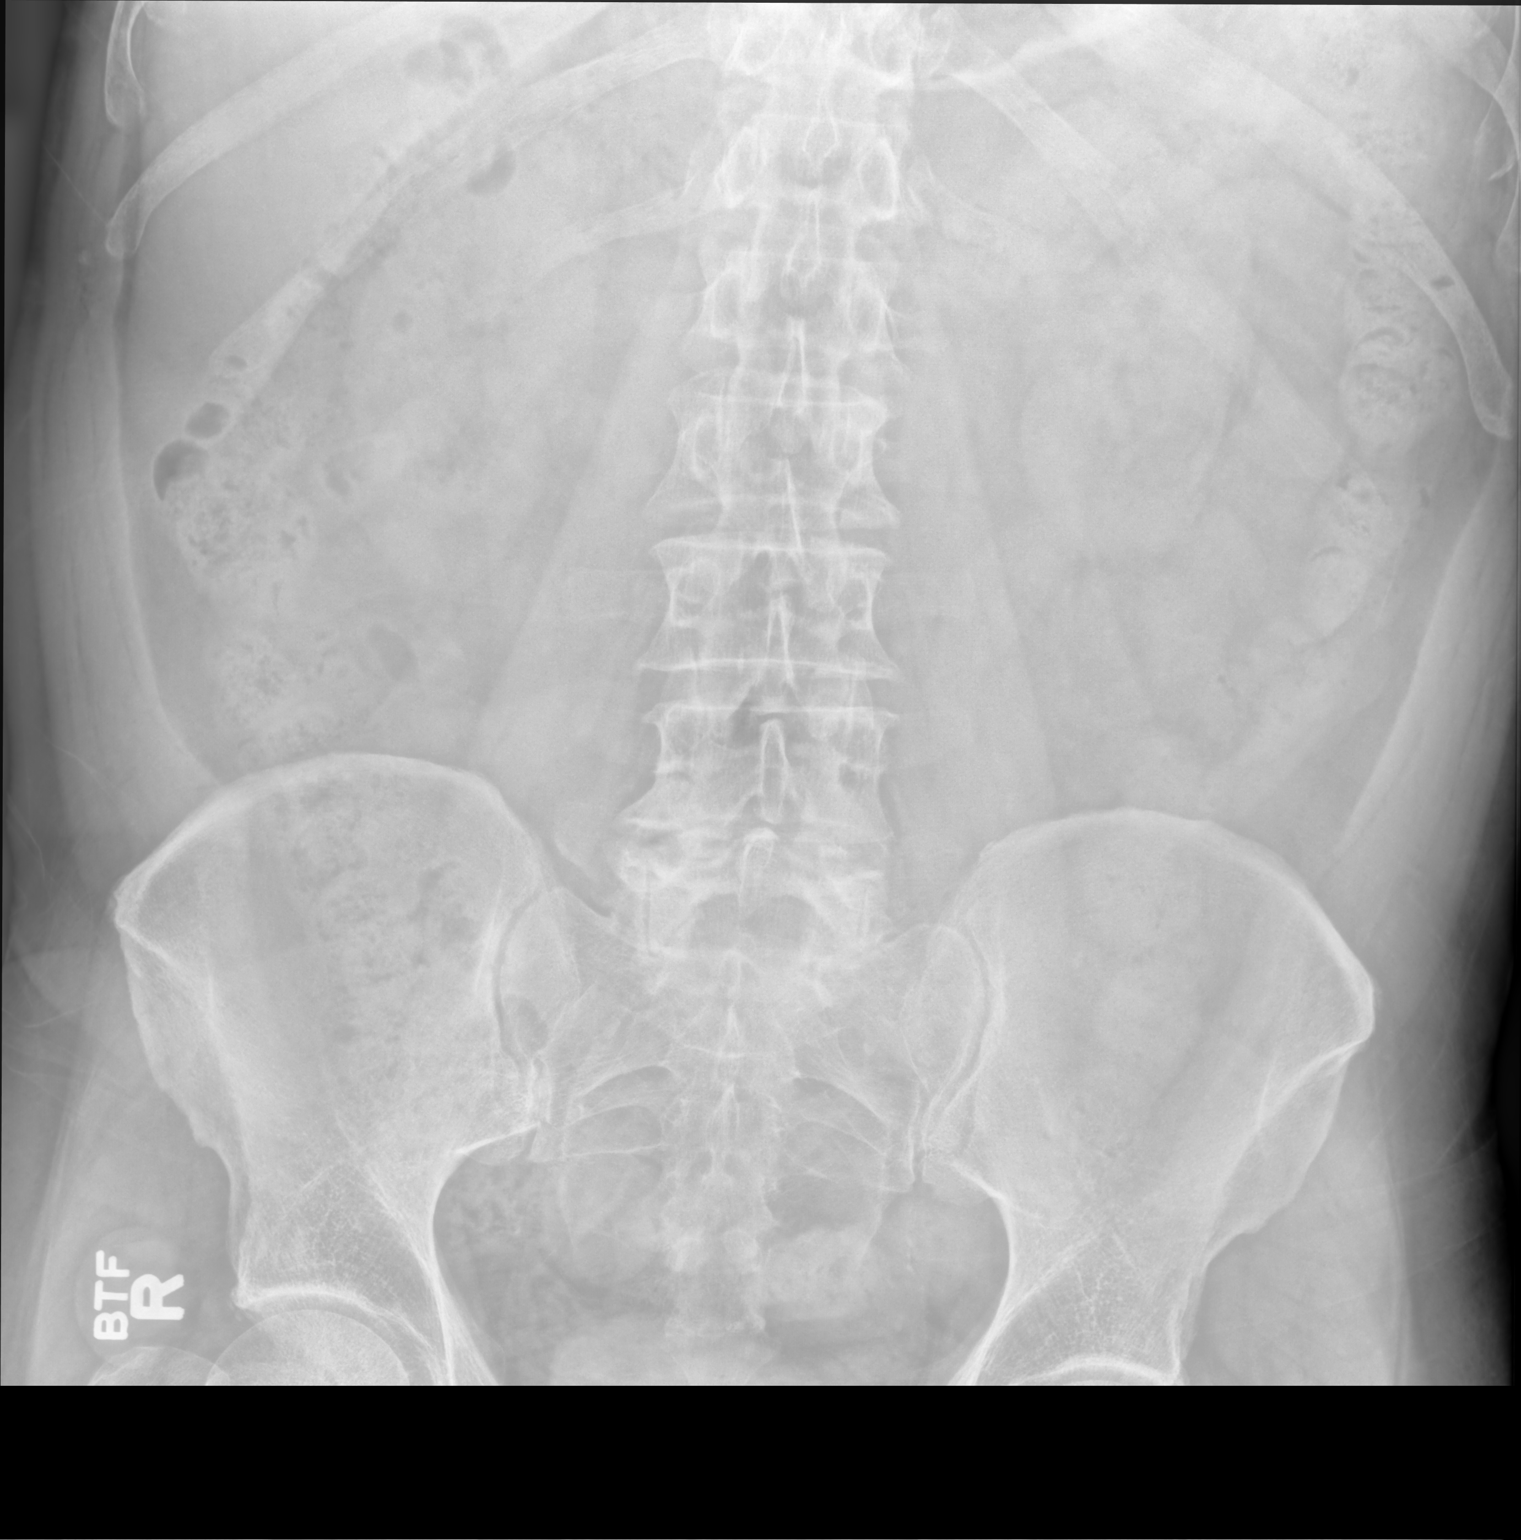

[1 of 1 positions shown; findings below may reference images not displayed]

FINDINGS: The colonic stool burden is moderate. No small or large bowel
obstructive pattern is observed. No abnormal calcifications are
observed overlying either kidney. Along the course of the ureters no
definite stones are observed. There are likely arterial
calcifications in the true pelvis. There is prostatic calcification.
There are degenerative changes of the lower lumbar spine.
IMPRESSION: No definite calcified urinary tract stones.

Moderately increased colonic stool burden may reflect constipation
in the appropriate clinical setting.

## 2019-06-25 LAB — COLOGUARD

## 2019-12-03 DIAGNOSIS — L218 Other seborrheic dermatitis: Secondary | ICD-10-CM | POA: Diagnosis not present

## 2019-12-03 DIAGNOSIS — L821 Other seborrheic keratosis: Secondary | ICD-10-CM | POA: Diagnosis not present

## 2019-12-03 DIAGNOSIS — D2271 Melanocytic nevi of right lower limb, including hip: Secondary | ICD-10-CM | POA: Diagnosis not present

## 2019-12-03 DIAGNOSIS — L570 Actinic keratosis: Secondary | ICD-10-CM | POA: Diagnosis not present

## 2019-12-03 DIAGNOSIS — D225 Melanocytic nevi of trunk: Secondary | ICD-10-CM | POA: Diagnosis not present

## 2019-12-03 DIAGNOSIS — D485 Neoplasm of uncertain behavior of skin: Secondary | ICD-10-CM | POA: Diagnosis not present

## 2019-12-06 ENCOUNTER — Other Ambulatory Visit: Payer: Self-pay | Admitting: Family Medicine

## 2020-01-15 DIAGNOSIS — H0288A Meibomian gland dysfunction right eye, upper and lower eyelids: Secondary | ICD-10-CM | POA: Diagnosis not present

## 2020-01-15 DIAGNOSIS — H26493 Other secondary cataract, bilateral: Secondary | ICD-10-CM | POA: Diagnosis not present

## 2020-01-15 DIAGNOSIS — H04123 Dry eye syndrome of bilateral lacrimal glands: Secondary | ICD-10-CM | POA: Diagnosis not present

## 2020-01-15 DIAGNOSIS — Z961 Presence of intraocular lens: Secondary | ICD-10-CM | POA: Diagnosis not present

## 2020-02-09 DIAGNOSIS — H0288A Meibomian gland dysfunction right eye, upper and lower eyelids: Secondary | ICD-10-CM | POA: Diagnosis not present

## 2020-02-09 DIAGNOSIS — H26491 Other secondary cataract, right eye: Secondary | ICD-10-CM | POA: Diagnosis not present

## 2020-02-09 DIAGNOSIS — H26493 Other secondary cataract, bilateral: Secondary | ICD-10-CM | POA: Diagnosis not present

## 2020-02-09 DIAGNOSIS — H0102A Squamous blepharitis right eye, upper and lower eyelids: Secondary | ICD-10-CM | POA: Diagnosis not present

## 2020-02-09 DIAGNOSIS — H04123 Dry eye syndrome of bilateral lacrimal glands: Secondary | ICD-10-CM | POA: Diagnosis not present

## 2020-06-10 ENCOUNTER — Other Ambulatory Visit: Payer: Self-pay | Admitting: Family Medicine

## 2020-08-12 DIAGNOSIS — H43391 Other vitreous opacities, right eye: Secondary | ICD-10-CM | POA: Diagnosis not present

## 2020-08-12 DIAGNOSIS — H04123 Dry eye syndrome of bilateral lacrimal glands: Secondary | ICD-10-CM | POA: Diagnosis not present

## 2020-08-12 DIAGNOSIS — H26492 Other secondary cataract, left eye: Secondary | ICD-10-CM | POA: Diagnosis not present

## 2020-08-12 DIAGNOSIS — H43811 Vitreous degeneration, right eye: Secondary | ICD-10-CM | POA: Diagnosis not present

## 2020-12-16 ENCOUNTER — Other Ambulatory Visit: Payer: Self-pay | Admitting: Family Medicine

## 2021-02-18 ENCOUNTER — Other Ambulatory Visit (HOSPITAL_COMMUNITY): Payer: Self-pay

## 2021-02-18 ENCOUNTER — Encounter (HOSPITAL_COMMUNITY)
Admission: EM | Disposition: A | Discharge status: Home / Self Care | Payer: Self-pay | Source: Home / Self Care | Attending: Cardiovascular Disease

## 2021-02-18 ENCOUNTER — Inpatient Hospital Stay (HOSPITAL_COMMUNITY)
Admission: EM | Admit: 2021-02-18 | Discharge: 2021-02-19 | DRG: 247 | Disposition: A | Discharge status: Home / Self Care | Payer: BLUE CROSS/BLUE SHIELD | Attending: Cardiovascular Disease | Admitting: Cardiovascular Disease

## 2021-02-18 ENCOUNTER — Encounter (HOSPITAL_COMMUNITY): Payer: Self-pay | Admitting: Cardiovascular Disease

## 2021-02-18 ENCOUNTER — Other Ambulatory Visit: Payer: Self-pay

## 2021-02-18 ENCOUNTER — Inpatient Hospital Stay (HOSPITAL_COMMUNITY): Payer: BLUE CROSS/BLUE SHIELD

## 2021-02-18 DIAGNOSIS — Z955 Presence of coronary angioplasty implant and graft: Secondary | ICD-10-CM

## 2021-02-18 DIAGNOSIS — I2102 ST elevation (STEMI) myocardial infarction involving left anterior descending coronary artery: Principal | ICD-10-CM | POA: Diagnosis present

## 2021-02-18 DIAGNOSIS — Z79899 Other long term (current) drug therapy: Secondary | ICD-10-CM

## 2021-02-18 DIAGNOSIS — I2510 Atherosclerotic heart disease of native coronary artery without angina pectoris: Secondary | ICD-10-CM | POA: Diagnosis present

## 2021-02-18 DIAGNOSIS — G43909 Migraine, unspecified, not intractable, without status migrainosus: Secondary | ICD-10-CM | POA: Diagnosis present

## 2021-02-18 DIAGNOSIS — Z20822 Contact with and (suspected) exposure to covid-19: Secondary | ICD-10-CM | POA: Diagnosis present

## 2021-02-18 DIAGNOSIS — E782 Mixed hyperlipidemia: Secondary | ICD-10-CM | POA: Diagnosis present

## 2021-02-18 DIAGNOSIS — I2119 ST elevation (STEMI) myocardial infarction involving other coronary artery of inferior wall: Secondary | ICD-10-CM | POA: Diagnosis present

## 2021-02-18 DIAGNOSIS — G4700 Insomnia, unspecified: Secondary | ICD-10-CM | POA: Diagnosis present

## 2021-02-18 DIAGNOSIS — Z841 Family history of disorders of kidney and ureter: Secondary | ICD-10-CM | POA: Diagnosis not present

## 2021-02-18 DIAGNOSIS — Z87442 Personal history of urinary calculi: Secondary | ICD-10-CM

## 2021-02-18 DIAGNOSIS — I2109 ST elevation (STEMI) myocardial infarction involving other coronary artery of anterior wall: Secondary | ICD-10-CM | POA: Diagnosis present

## 2021-02-18 DIAGNOSIS — E785 Hyperlipidemia, unspecified: Secondary | ICD-10-CM | POA: Diagnosis present

## 2021-02-18 DIAGNOSIS — R079 Chest pain, unspecified: Secondary | ICD-10-CM

## 2021-02-18 DIAGNOSIS — I10 Essential (primary) hypertension: Secondary | ICD-10-CM | POA: Diagnosis present

## 2021-02-18 DIAGNOSIS — Z8249 Family history of ischemic heart disease and other diseases of the circulatory system: Secondary | ICD-10-CM

## 2021-02-18 DIAGNOSIS — I213 ST elevation (STEMI) myocardial infarction of unspecified site: Secondary | ICD-10-CM | POA: Diagnosis not present

## 2021-02-18 HISTORY — PX: CORONARY/GRAFT ACUTE MI REVASCULARIZATION: CATH118305

## 2021-02-18 HISTORY — PX: INTRAVASCULAR ULTRASOUND/IVUS: CATH118244

## 2021-02-18 HISTORY — PX: LEFT HEART CATH AND CORONARY ANGIOGRAPHY: CATH118249

## 2021-02-18 LAB — COMPREHENSIVE METABOLIC PANEL
ALT: 29 U/L (ref 0–44)
AST: 28 U/L (ref 15–41)
Albumin: 4.1 g/dL (ref 3.5–5.0)
Alkaline Phosphatase: 87 U/L (ref 38–126)
Anion gap: 9 (ref 5–15)
BUN: 16 mg/dL (ref 8–23)
CO2: 23 mmol/L (ref 22–32)
Calcium: 9.6 mg/dL (ref 8.9–10.3)
Chloride: 105 mmol/L (ref 98–111)
Creatinine, Ser: 1.05 mg/dL (ref 0.61–1.24)
GFR, Estimated: >60 mL/min (ref >60)
Glucose, Bld: 137 mg/dL — ABNORMAL HIGH (ref 70–99)
Potassium: 4.4 mmol/L (ref 3.5–5.1)
Sodium: 137 mmol/L (ref 135–145)
Total Bilirubin: 1 mg/dL (ref 0.3–1.2)
Total Protein: 7.5 g/dL (ref 6.5–8.1)

## 2021-02-18 LAB — CBC WITH DIFFERENTIAL/PLATELET
Abs Immature Granulocytes: 0.03 10*3/uL (ref 0.00–0.07)
Basophils Absolute: 0 10*3/uL (ref 0.0–0.1)
Basophils Relative: 0 %
Eosinophils Absolute: 0.4 10*3/uL (ref 0.0–0.5)
Eosinophils Relative: 5 %
HCT: 52.1 % — ABNORMAL HIGH (ref 39.0–52.0)
Hemoglobin: 17.4 g/dL — ABNORMAL HIGH (ref 13.0–17.0)
Immature Granulocytes: 0 %
Lymphocytes Relative: 18 %
Lymphs Abs: 1.6 10*3/uL (ref 0.7–4.0)
MCH: 29.5 pg (ref 26.0–34.0)
MCHC: 33.4 g/dL (ref 30.0–36.0)
MCV: 88.5 fL (ref 80.0–100.0)
Monocytes Absolute: 0.5 10*3/uL (ref 0.1–1.0)
Monocytes Relative: 6 %
Neutro Abs: 6.2 10*3/uL (ref 1.7–7.7)
Neutrophils Relative %: 71 %
Platelets: 240 10*3/uL (ref 150–400)
RBC: 5.89 MIL/uL — ABNORMAL HIGH (ref 4.22–5.81)
RDW: 13.3 % (ref 11.5–15.5)
WBC: 8.8 10*3/uL (ref 4.0–10.5)
nRBC: 0 % (ref 0.0–0.2)

## 2021-02-18 LAB — LIPID PANEL
Cholesterol: 212 mg/dL — ABNORMAL HIGH (ref 0–200)
HDL: 44 mg/dL (ref >40)
LDL Cholesterol: 151 mg/dL — ABNORMAL HIGH (ref 0–99)
Total CHOL/HDL Ratio: 4.8 RATIO
Triglycerides: 87 mg/dL (ref <150)
VLDL: 17 mg/dL (ref 0–40)

## 2021-02-18 LAB — TROPONIN I (HIGH SENSITIVITY)
Troponin I (High Sensitivity): 31 ng/L — ABNORMAL HIGH (ref <18)
Troponin I (High Sensitivity): 370 ng/L (ref <18)
Troponin I (High Sensitivity): 771 ng/L (ref <18)

## 2021-02-18 LAB — ECHOCARDIOGRAM COMPLETE
Area-P 1/2: 1.94 cm2
Height: 72 in
S' Lateral: 3.5 cm
Weight: 3168 oz

## 2021-02-18 LAB — RESP PANEL BY RT-PCR (FLU A&B, COVID) ARPGX2
Influenza A by PCR: NEGATIVE
Influenza B by PCR: NEGATIVE
SARS Coronavirus 2 by RT PCR: NEGATIVE

## 2021-02-18 LAB — MRSA NEXT GEN BY PCR, NASAL: MRSA by PCR Next Gen: NOT DETECTED

## 2021-02-18 LAB — POCT I-STAT, CHEM 8
BUN: 16 mg/dL (ref 8–23)
Calcium, Ion: 1.1 mmol/L — ABNORMAL LOW (ref 1.15–1.40)
Chloride: 105 mmol/L (ref 98–111)
Creatinine, Ser: 0.8 mg/dL (ref 0.61–1.24)
Glucose, Bld: 114 mg/dL — ABNORMAL HIGH (ref 70–99)
HCT: 43 % (ref 39.0–52.0)
Hemoglobin: 14.6 g/dL (ref 13.0–17.0)
Potassium: 3.6 mmol/L (ref 3.5–5.1)
Sodium: 141 mmol/L (ref 135–145)
TCO2: 19 mmol/L — ABNORMAL LOW (ref 22–32)

## 2021-02-18 LAB — HEMOGLOBIN A1C
Hgb A1c MFr Bld: 5.7 % — ABNORMAL HIGH (ref 4.8–5.6)
Mean Plasma Glucose: 116.89 mg/dL

## 2021-02-18 LAB — POCT ACTIVATED CLOTTING TIME
Activated Clotting Time: 254 seconds
Activated Clotting Time: 445 seconds

## 2021-02-18 LAB — APTT: aPTT: 25 seconds (ref 24–36)

## 2021-02-18 LAB — PROTIME-INR
INR: 0.9 (ref 0.8–1.2)
Prothrombin Time: 12.6 seconds (ref 11.4–15.2)

## 2021-02-18 SURGERY — LEFT HEART CATH AND CORONARY ANGIOGRAPHY
Anesthesia: LOCAL

## 2021-02-18 MED ORDER — SODIUM CHLORIDE 0.9% FLUSH
3.0000 mL | Freq: Two times a day (BID) | INTRAVENOUS | Status: DC
  Administered 2021-02-19: 3 mL via INTRAVENOUS

## 2021-02-18 MED ORDER — HEPARIN SODIUM (PORCINE) 1000 UNIT/ML IJ SOLN
INTRAMUSCULAR | Status: AC
  Filled 2021-02-18: qty 1

## 2021-02-18 MED ORDER — FENTANYL CITRATE (PF) 100 MCG/2ML IJ SOLN
INTRAMUSCULAR | Status: DC | PRN
  Administered 2021-02-18: 50 ug via INTRAVENOUS

## 2021-02-18 MED ORDER — SODIUM CHLORIDE 0.9 % IV SOLN: INTRAVENOUS | Status: DC

## 2021-02-18 MED ORDER — SODIUM CHLORIDE 0.9% FLUSH: 3.0000 mL | INTRAVENOUS | Status: DC | PRN

## 2021-02-18 MED ORDER — LIDOCAINE HCL (PF) 1 % IJ SOLN
INTRAMUSCULAR | Status: DC | PRN
  Administered 2021-02-18: 2 mL

## 2021-02-18 MED ORDER — VERAPAMIL HCL 2.5 MG/ML IV SOLN
INTRAVENOUS | Status: DC | PRN
  Administered 2021-02-18: 10 mL via INTRA_ARTERIAL

## 2021-02-18 MED ORDER — SODIUM CHLORIDE 0.9 % IV SOLN: INTRAVENOUS | Status: AC

## 2021-02-18 MED ORDER — HEPARIN (PORCINE) IN NACL 1000-0.9 UT/500ML-% IV SOLN
INTRAVENOUS | Status: DC | PRN
  Administered 2021-02-18 (×2): 500 mL

## 2021-02-18 MED ORDER — SODIUM CHLORIDE 0.9 % IV SOLN: 250.0000 mL | INTRAVENOUS | Status: DC | PRN

## 2021-02-18 MED ORDER — NITROGLYCERIN 1 MG/10 ML FOR IR/CATH LAB
INTRA_ARTERIAL | Status: AC
  Filled 2021-02-18: qty 10

## 2021-02-18 MED ORDER — TIROFIBAN (AGGRASTAT) BOLUS VIA INFUSION
INTRAVENOUS | Status: DC | PRN
  Administered 2021-02-18: 2245 ug via INTRAVENOUS

## 2021-02-18 MED ORDER — IOHEXOL 350 MG/ML SOLN
INTRAVENOUS | Status: DC | PRN
  Administered 2021-02-18: 100 mL

## 2021-02-18 MED ORDER — FENTANYL CITRATE (PF) 100 MCG/2ML IJ SOLN
INTRAMUSCULAR | Status: AC
  Filled 2021-02-18: qty 2

## 2021-02-18 MED ORDER — MIDAZOLAM HCL 2 MG/2ML IJ SOLN
INTRAMUSCULAR | Status: DC | PRN
  Administered 2021-02-18: 2 mg via INTRAVENOUS

## 2021-02-18 MED ORDER — LISINOPRIL 5 MG PO TABS
5.0000 mg | ORAL_TABLET | Freq: Every day | ORAL | Status: DC
  Administered 2021-02-18 – 2021-02-19 (×2): 5 mg via ORAL
  Filled 2021-02-18 (×2): qty 1

## 2021-02-18 MED ORDER — MIDAZOLAM HCL 2 MG/2ML IJ SOLN
INTRAMUSCULAR | Status: AC
  Filled 2021-02-18: qty 2

## 2021-02-18 MED ORDER — ONDANSETRON HCL 4 MG/2ML IJ SOLN: 4.0000 mg | Freq: Four times a day (QID) | INTRAMUSCULAR | Status: DC | PRN

## 2021-02-18 MED ORDER — HEPARIN (PORCINE) IN NACL 1000-0.9 UT/500ML-% IV SOLN
INTRAVENOUS | Status: AC
  Filled 2021-02-18: qty 1000

## 2021-02-18 MED ORDER — HEPARIN BOLUS VIA INFUSION
4000.0000 [IU] | Freq: Once | INTRAVENOUS | Status: AC
  Administered 2021-02-18: 4000 [IU] via INTRAVENOUS
  Filled 2021-02-18: qty 4000

## 2021-02-18 MED ORDER — ASPIRIN 81 MG PO CHEW
81.0000 mg | CHEWABLE_TABLET | Freq: Every day | ORAL | Status: DC
  Administered 2021-02-19: 81 mg via ORAL
  Filled 2021-02-18: qty 1

## 2021-02-18 MED ORDER — TICAGRELOR 90 MG PO TABS
ORAL_TABLET | ORAL | Status: DC | PRN
  Administered 2021-02-18: 180 mg via ORAL

## 2021-02-18 MED ORDER — NITROGLYCERIN 1 MG/10 ML FOR IR/CATH LAB
INTRA_ARTERIAL | Status: DC | PRN
  Administered 2021-02-18 (×2): 200 ug via INTRACORONARY

## 2021-02-18 MED ORDER — TICAGRELOR 90 MG PO TABS
ORAL_TABLET | ORAL | Status: AC
  Filled 2021-02-18: qty 1

## 2021-02-18 MED ORDER — TICAGRELOR 90 MG PO TABS
90.0000 mg | ORAL_TABLET | Freq: Two times a day (BID) | ORAL | Status: DC
  Administered 2021-02-18 – 2021-02-19 (×2): 90 mg via ORAL
  Filled 2021-02-18 (×2): qty 1

## 2021-02-18 MED ORDER — VERAPAMIL HCL 2.5 MG/ML IV SOLN
INTRAVENOUS | Status: AC
  Filled 2021-02-18: qty 2

## 2021-02-18 MED ORDER — HYDRALAZINE HCL 20 MG/ML IJ SOLN
10.0000 mg | INTRAMUSCULAR | Status: AC | PRN
Start: 2021-02-18 — End: 2021-02-18

## 2021-02-18 MED ORDER — ACETAMINOPHEN 325 MG PO TABS
650.0000 mg | ORAL_TABLET | ORAL | Status: DC | PRN
  Administered 2021-02-18 (×2): 650 mg via ORAL
  Filled 2021-02-18 (×2): qty 2

## 2021-02-18 MED ORDER — HEPARIN SODIUM (PORCINE) 1000 UNIT/ML IJ SOLN
INTRAMUSCULAR | Status: DC | PRN
  Administered 2021-02-18: 3000 [IU] via INTRAVENOUS
  Administered 2021-02-18: 8000 [IU] via INTRAVENOUS

## 2021-02-18 MED ORDER — LABETALOL HCL 5 MG/ML IV SOLN
10.0000 mg | INTRAVENOUS | Status: AC | PRN
Start: 2021-02-18 — End: 2021-02-18

## 2021-02-18 MED ORDER — TIROFIBAN HCL IN NACL 5-0.9 MG/100ML-% IV SOLN: 0.1500 ug/kg/min | INTRAVENOUS | Status: DC

## 2021-02-18 MED ORDER — METOPROLOL TARTRATE 25 MG PO TABS
25.0000 mg | ORAL_TABLET | Freq: Two times a day (BID) | ORAL | Status: DC
  Administered 2021-02-18 – 2021-02-19 (×2): 25 mg via ORAL
  Filled 2021-02-18 (×2): qty 1

## 2021-02-18 MED ORDER — TIROFIBAN HCL IN NACL 5-0.9 MG/100ML-% IV SOLN
INTRAVENOUS | Status: AC
  Filled 2021-02-18: qty 100

## 2021-02-18 MED ORDER — ATORVASTATIN CALCIUM 80 MG PO TABS
80.0000 mg | ORAL_TABLET | Freq: Every day | ORAL | Status: DC
  Administered 2021-02-18 – 2021-02-19 (×2): 80 mg via ORAL
  Filled 2021-02-18 (×2): qty 1

## 2021-02-18 MED ORDER — LIDOCAINE HCL (PF) 1 % IJ SOLN
INTRAMUSCULAR | Status: AC
  Filled 2021-02-18: qty 30

## 2021-02-18 MED ORDER — HEPARIN (PORCINE) 25000 UT/250ML-% IV SOLN
1100.0000 [IU]/h | INTRAVENOUS | Status: DC
  Filled 2021-02-18: qty 250

## 2021-02-18 MED ORDER — TIROFIBAN HCL IN NACL 5-0.9 MG/100ML-% IV SOLN
INTRAVENOUS | Status: AC | PRN
  Administered 2021-02-18: .15 ug/kg/min via INTRAVENOUS

## 2021-02-18 SURGICAL SUPPLY — 19 items
BALLN SAPPHIRE 2.0X12 (BALLOONS) ×2
BALLN SAPPHIRE ~~LOC~~ 3.75X12 (BALLOONS) ×2 IMPLANT
BALLOON SAPPHIRE 2.0X12 (BALLOONS) ×1 IMPLANT
CATH 5FR JL3.5 JR4 ANG PIG MP (CATHETERS) ×2 IMPLANT
CATH OPTICROSS HD (CATHETERS) ×2 IMPLANT
CATH VISTA GUIDE 6FR XBLAD3.5 (CATHETERS) ×2 IMPLANT
DEVICE RAD COMP TR BAND LRG (VASCULAR PRODUCTS) ×2 IMPLANT
GLIDESHEATH SLEND SS 6F .021 (SHEATH) ×2 IMPLANT
GUIDEWIRE INQWIRE 1.5J.035X260 (WIRE) ×1 IMPLANT
INQWIRE 1.5J .035X260CM (WIRE) ×2
KIT ENCORE 26 ADVANTAGE (KITS) ×2 IMPLANT
KIT HEART LEFT (KITS) ×2 IMPLANT
PACK CARDIAC CATHETERIZATION (CUSTOM PROCEDURE TRAY) ×2 IMPLANT
SLED PULL BACK IVUS (MISCELLANEOUS) ×2 IMPLANT
STENT ONYX FRONTIER 3.0X18 (Permanent Stent) ×2 IMPLANT
SYR MEDRAD MARK 7 150ML (SYRINGE) ×2 IMPLANT
TRANSDUCER W/STOPCOCK (MISCELLANEOUS) ×2 IMPLANT
TUBING CIL FLEX 10 FLL-RA (TUBING) ×2 IMPLANT
WIRE COUGAR XT STRL 190CM (WIRE) ×2 IMPLANT

## 2021-02-18 NOTE — Progress Notes (Signed)
  Echocardiogram 2D Echocardiogram has been performed.  Melvin Faulkner 02/18/2021, 3:16 PM

## 2021-02-18 NOTE — ED Triage Notes (Signed)
Pt bib ems with chest pain onset while running on the treadmill. Pt complains of pressure and some nausea. Pain went into his L arm. Stemi activated en route. Given 324mg  aspirin and 1 sl nitro en route. BP 190/100. 20g R hand.

## 2021-02-18 NOTE — ED Notes (Signed)
Got patient on the monitor did ekg shown to Dr Rush Landmark

## 2021-02-18 NOTE — H&P (Signed)
Cardiology Admission History and Physical:   Patient ID: Melvin Faulkner MRN: [id number]; DOB: August 17, 1955   Admission date: 02/18/2021  PCP:  Pcp, No   [facility] Providers Cardiologist:  None        Chief Complaint:  chest pain  Patient Profile:   Melvin Faulkner is a 65 y.o. male with HTN who is being seen 02/18/2021 for the evaluation of STEMI.  History of Present Illness:   Melvin Faulkner is a 65 year old gentleman with no personal history of cardiac disease.  He was exercising on the treadmill today and at about the 2 mile mark he began to have substernal chest discomfort.  He stopped exercising but his pain intensified.  He presented to the emergency department via EMS.  EKG suggested inferior injury and a code STEMI is paged out.  The patient is noted to be severely hypertensive on presentation.  At the time of my evaluation, his pain has significantly improved and he is complaining of very mild residual discomfort on the left chest.  He denies diaphoresis, nausea, vomiting, or shortness of breath.  He denies any prodromal symptoms.  The patient does not smoke.  He denies a family history of premature CAD.  He reports well-controlled blood pressure on lisinopril.   Past Medical History:  Diagnosis Date   Bereavement    lost son 2010   Frequent headaches    History of chicken pox    History of kidney stones    Hypertension    Insomnia    Migraine     No past surgical history on file.   Medications Prior to Admission: Prior to Admission medications   Medication Sig Start Date End Date Taking? Authorizing Provider  lisinopril (ZESTRIL) 5 MG tablet TAKE 1 TABLET(5 MG) BY MOUTH DAILY 12/16/20   Koberlein, Jannette Spanner C, MD  zolpidem (AMBIEN) 5 MG tablet Take 1 tablet (5 mg total) by mouth at bedtime as needed for sleep. 05/23/19   Wynn Banker, MD     Allergies:   No Known Allergies  Social History:   Social History   Socioeconomic History    Marital status: Married    Spouse name: Not on file   Number of children: Not on file   Years of education: Not on file   Highest education level: Not on file  Occupational History   Not on file  Tobacco Use   Smoking status: Never   Smokeless tobacco: Never  Substance and Sexual Activity   Alcohol use: Yes    Alcohol/week: 0.0 standard drinks    Comment: 1 drink twice per month   Drug use: Never   Sexual activity: Not on file  Other Topics Concern   Not on file  Social History Narrative   Updated 01/2015   Work or Archivist - Holiday representative      Home Situation: lives with wife and children - wife and children see me      Spiritual Beliefs: Christian - strong faith      Lifestyle: runs several miles per day, weights; healthy diet      Social Determinants of Health   Financial Resource Strain: Not on file  Food Insecurity: Not on file  Transportation Needs: Not on file  Physical Activity: Not on file  Stress: Not on file  Social Connections: Not on file  Intimate Partner Violence: Not on file    Family History:   The patient's family history includes Alcoholism in his daughter; Alzheimer's disease in his  paternal grandmother; Alzheimer's disease (age of onset: 57) in his father; Arthritis in his paternal grandmother; Congestive Heart Failure in his mother; Healthy in his sister and sister; Kidney disease in his father; Lung disease in his mother; Mental illness in his son.    ROS:  Please see the history of present illness.  Positive for chronic cough.  All other ROS reviewed and negative.     Physical Exam/Data:   Vitals:   02/18/21 0859 02/18/21 0900 02/18/21 0900 02/18/21 0921  BP:  (!) 147/80    Pulse:  60    Resp:  17    Temp:   (!) 97.5 F (36.4 C)   TempSrc:   Oral   SpO2:  96%  98%  Weight: 89.8 kg     Height: 6' (1.829 m)      No intake or output data in the 24 hours ending 02/18/21 0928 Last 3 Weights 02/18/2021 05/23/2019 01/24/2018  Weight  (lbs) 198 lb 205 lb 188 lb 9.6 oz  Weight (kg) 89.812 kg 92.987 kg 85.548 kg     Body mass index is 26.85 kg/m.  General:  Well nourished, well developed, in no acute distress HEENT: normal Neck: no JVD Vascular: No carotid bruits; Distal pulses 2+ bilaterally   Cardiac:  normal S1, S2; RRR; no murmur  Lungs:  clear to auscultation bilaterally, no wheezing, rhonchi or rales  Abd: soft, nontender, no hepatomegaly  Ext: no edema Musculoskeletal:  No deformities, BUE and BLE strength normal and equal Skin: warm and dry  Neuro:  CNs 2-12 intact, no focal abnormalities noted Psych:  Normal affect    EKG:  The ECG that was done today was personally reviewed and demonstrates normal sinus rhythm with inferolateral ST elevation, consider inferolateral injury  Relevant CV Studies: Pending  Laboratory Data:  High Sensitivity Troponin:  No results for input(s): TROPONINIHS in the last 720 hours.    ChemistryNo results for input(s): NA, K, CL, CO2, GLUCOSE, BUN, CREATININE, CALCIUM, MG, GFRNONAA, GFRAA, ANIONGAP in the last 168 hours.  No results for input(s): PROT, ALBUMIN, AST, ALT, ALKPHOS, BILITOT in the last 168 hours. Lipids No results for input(s): CHOL, TRIG, HDL, LABVLDL, LDLCALC, CHOLHDL in the last 168 hours. Hematology Recent Labs  Lab 02/18/21 0902  WBC 8.8  RBC 5.89*  HGB 17.4*  HCT 52.1*  MCV 88.5  MCH 29.5  MCHC 33.4  RDW 13.3  PLT 240   Thyroid No results for input(s): TSH, FREET4 in the last 168 hours. BNPNo results for input(s): BNP, PROBNP in the last 168 hours.  DDimer No results for input(s): DDIMER in the last 168 hours.   Radiology/Studies:  No results found.   Assessment and Plan:   Acute inferolateral STEMI: Borderline EKG changes but highly suggestive story.  Recommend emergency cardiac catheterization and primary PCI.  Reviewed risks, indications, and alternatives with the patient and his wife.  They understand the risk of serious complication  including vascular injury, bleeding, myocardial infarction, stroke, and death, is approximately 1% with PCI.  They understand the risk of serious complication with diagnostic catheterization as lower at 0.1 to 0.2%.  The patient will be taken emergently for cardiac catheterization and PCI.  Discussed with interventional cardiology team.  The patient's wife is included via telephone as she currently is sick.  All of her questions were answered.  Further plans/disposition pending cardiac catheterization results. Hypertension: Adjust antihypertensive medications for post MI medical therapy. Mixed hyperlipidemia: Check lipids.  Initiate high intensity  statin drug.  Disposition: Pending cardiac catheterization results.   Risk Assessment/Risk Scores:    TIMI Risk Score for ST  Elevation MI:   The patient's TIMI risk score is 2, which indicates a 2.2% risk of all cause mortality at 30 days.        Severity of Illness: The appropriate patient status for this patient is INPATIENT. Inpatient status is judged to be reasonable and necessary in order to provide the required intensity of service to ensure the patient's safety. The patient's presenting symptoms, physical exam findings, and initial radiographic and laboratory data in the context of their chronic comorbidities is felt to place them at high risk for further clinical deterioration. Furthermore, it is not anticipated that the patient will be medically stable for discharge from the hospital within 2 midnights of admission.   * I certify that at the point of admission it is my clinical judgment that the patient will require inpatient hospital care spanning beyond 2 midnights from the point of admission due to high intensity of service, high risk for further deterioration and high frequency of surveillance required.*   For questions or updates, please contact [facility] Please consult [email or web address] for contact info under     Signed, Tonny Bollman, MD  02/18/2021 9:28 AM

## 2021-02-18 NOTE — Progress Notes (Signed)
Responded to code stemi to support patient. Provided emotional support to patient and staff. Chaplain available as needed.  Venida Jarvis, [place], [facility], Pager [phone]

## 2021-02-18 NOTE — Plan of Care (Signed)

## 2021-02-18 NOTE — TOC Benefit Eligibility Note (Signed)
Patient Product/process development scientist completed.    The patient is currently admitted and upon discharge could be taking Brilinta 90 mg.  The current 30 day co-pay is, $245.48 due to a $200.48 deductible remaining.   The patient is insured through [employer]     Roland Earl, CPhT Pharmacy Patient Advocate Specialist [facility] Patient Advocate Team Direct Number: [phone]  Fax: [phone]

## 2021-02-18 NOTE — ED Provider Notes (Signed)
[facility] EMERGENCY DEPARTMENT Provider Note   CSN: [id number] Arrival date & time: 02/18/21  4098     History Chief Complaint  Patient presents with   Code STEMI    Melvin Faulkner is a 65 y.o. male.  The history is provided by the patient and the EMS personnel. No language interpreter was used.  Chest Pain Pain location:  L chest Pain quality: aching, crushing and pressure   Pain radiates to:  L shoulder and L arm Pain severity:  Severe Onset quality:  Sudden Duration:  1 hour Timing:  Constant Progression:  Improving Chronicity:  New Relieved by:  Aspirin and nitroglycerin Worsened by:  Nothing Ineffective treatments:  None tried Associated symptoms: diaphoresis, nausea and shortness of breath   Associated symptoms: no abdominal pain, no back pain, no cough, no fatigue, no fever, no headache, no lower extremity edema, no near-syncope, no palpitations and no vomiting       Past Medical History:  Diagnosis Date   Bereavement    lost son 2010   Frequent headaches    History of chicken pox    History of kidney stones    Hypertension    Insomnia    Migraine     Patient Active Problem List   Diagnosis Date Noted   Esophageal reflux 11/16/2015   Essential hypertension 02/16/2015   Hyperlipemia 02/16/2015   Insomnia 02/16/2015   Stress at work 02/16/2015    No past surgical history on file.     Family History  Problem Relation Age of Onset   Arthritis Paternal Grandmother        ?rheumatoid/bedridden   Alzheimer's disease Paternal Grandmother    Alcoholism Daughter    Congestive Heart Failure Mother    Lung disease Mother        recurrent pneumonia/resp issues   Kidney disease Father    Alzheimer's disease Father 74   Mental illness Son        suicide   Healthy Sister    Healthy Sister     Social History   Tobacco Use   Smoking status: Never   Smokeless tobacco: Never  Substance Use Topics   Alcohol use: Yes     Alcohol/week: 0.0 standard drinks    Comment: 1 drink twice per month   Drug use: Never    Home Medications Prior to Admission medications   Medication Sig Start Date End Date Taking? Authorizing Provider  lisinopril (ZESTRIL) 5 MG tablet TAKE 1 TABLET(5 MG) BY MOUTH DAILY 12/16/20   Koberlein, Jannette Spanner C, MD  zolpidem (AMBIEN) 5 MG tablet Take 1 tablet (5 mg total) by mouth at bedtime as needed for sleep. 05/23/19   Wynn Banker, MD    Allergies    Patient has no known allergies.  Review of Systems   Review of Systems  Constitutional:  Positive for diaphoresis. Negative for chills, fatigue and fever.  HENT:  Negative for congestion.   Respiratory:  Positive for shortness of breath. Negative for cough, chest tightness and wheezing.   Cardiovascular:  Positive for chest pain. Negative for palpitations, leg swelling and near-syncope.  Gastrointestinal:  Positive for nausea. Negative for abdominal pain, constipation, diarrhea and vomiting.  Musculoskeletal:  Negative for back pain, neck pain and neck stiffness.  Skin:  Negative for rash and wound.  Neurological:  Negative for headaches.  Psychiatric/Behavioral:  Negative for agitation.   All other systems reviewed and are negative.  Physical Exam Updated Vital Signs BP Marland Kitchen)  147/80   Pulse 60   Temp (!) 97.5 F (36.4 C) (Oral)   Resp 17   Ht 6' (1.829 m)   Wt 89.8 kg   SpO2 96%   BMI 26.85 kg/m   Physical Exam Vitals and nursing note reviewed.  Constitutional:      General: He is not in acute distress.    Appearance: He is well-developed. He is not ill-appearing, toxic-appearing or diaphoretic.  HENT:     Head: Normocephalic and atraumatic.     Mouth/Throat:     Mouth: Mucous membranes are moist.  Eyes:     Conjunctiva/sclera: Conjunctivae normal.  Cardiovascular:     Rate and Rhythm: Normal rate and regular rhythm.     Heart sounds: No murmur heard. Pulmonary:     Effort: Pulmonary effort is normal. No  respiratory distress.     Breath sounds: Normal breath sounds. No wheezing, rhonchi or rales.  Chest:     Chest wall: No tenderness.  Abdominal:     General: Abdomen is flat.     Palpations: Abdomen is soft.     Tenderness: There is no abdominal tenderness. There is no guarding or rebound.  Musculoskeletal:        General: No tenderness.     Cervical back: Neck supple.     Right lower leg: No edema.     Left lower leg: No edema.  Skin:    General: Skin is warm and dry.     Findings: No erythema or rash.  Neurological:     General: No focal deficit present.     Mental Status: He is alert. Mental status is at baseline.    ED Results / Procedures / Treatments   Labs (all labs ordered are listed, but only abnormal results are displayed) Labs Reviewed  RESP PANEL BY RT-PCR (FLU A&B, COVID) ARPGX2  HEMOGLOBIN A1C  CBC WITH DIFFERENTIAL/PLATELET  PROTIME-INR  APTT  COMPREHENSIVE METABOLIC PANEL  LIPID PANEL  TROPONIN I (HIGH SENSITIVITY)    EKG EKG Interpretation  Date/Time:  Friday February 18 2021 08:58:08 EDT Ventricular Rate:  60 PR Interval:  168 QRS Duration: 110 QT Interval:  433 QTC Calculation: 433 R Axis:   47 Text Interpretation: Sinus rhythm Inferior infarct, acute (LCx) ST elevation, consider anterolateral injury >>> Acute MI <<< ** ** ACUTE MI / STEMI ** ** No prior for comparison. Confirmed by Theda Belfast ([id number]) on 02/18/2021 9:07:15 AM  Radiology No results found.  Procedures Procedures   CRITICAL CARE Performed by: Canary Brim Aerie Donica Total critical care time: 30 minutes Critical care time was exclusive of separately billable procedures and treating other patients. Critical care was necessary to treat or prevent imminent or life-threatening deterioration. Critical care was time spent personally by me on the following activities: development of treatment plan with patient and/or surrogate as well as nursing, discussions with consultants,  evaluation of patient's response to treatment, examination of patient, obtaining history from patient or surrogate, ordering and performing treatments and interventions, ordering and review of laboratory studies, ordering and review of radiographic studies, pulse oximetry and re-evaluation of patient's condition.   Medications Ordered in ED Medications  0.9 %  sodium chloride infusion (has no administration in time range)    ED Course  I have reviewed the triage vital signs and the nursing notes.  Pertinent labs & imaging results that were available during my care of the patient were reviewed by me and considered in my medical decision making (see chart  for details).    MDM Rules/Calculators/A&P                           Melvin Faulkner is a 65 y.o. male with a reported past medical history significant for hypertension hyperlipidemia who presents with chest pain.  He describes that he was on the treadmill completing a 2 mile run when he had a sudden onset of crushing pressure in his left chest.  It goes into his left shoulder and down his left arm with tingling.  He reports continued diaphoresis, some shortness of breath, some nausea, and had to stop.  He called EMS and during transport had EKG worsening prompting them to activate a code STEMI just prior to arrival.  Blood pressure was in the 190s with EMS and after nitro his chest pain has nearly resolved down to a 1 out of 10 down from a 9 out of 10 and his blood pressure is also improved at 147/80 on arrival.  He denies any trauma he denies any history of similar discomfort.  He received aspirin with EMS as well as initial sublingual nitroglycerin.  He denies any early family history of heart disease and reports he had a recent physical exam for health insurance 2 months ago that was very reassuring.  On exam, lungs are clear and chest was nontender.  No murmur.  Good pulses in both upper and lower extremities.  No rash seen.   Symptoms have improved upon arrival.  We upheld the code STEMI initially and will have cardiology come see the patient.  We will get work-up and labs including a troponin and a COVID test.  Per EMS, his spouse has had recent URI symptoms.  Anticipate follow-up on cardiology recommendations   9:13 AM Cardiology reports that they will take him to the Cath Lab immediately.  Patient be admitted by cardiology after catheterization.    Final Clinical Impression(s) / ED Diagnoses Final diagnoses:  ST elevation myocardial infarction (STEMI), unspecified artery (HCC)  Chest pain, unspecified type     Clinical Impression: 1. ST elevation myocardial infarction (STEMI), unspecified artery (HCC)   2. Chest pain, unspecified type     Disposition: Admit  This note was prepared with assistance of Dragon voice recognition software. Occasional wrong-word or sound-a-like substitutions may have occurred due to the inherent limitations of voice recognition software.     Nihaal Friesen, Canary Brim, MD 02/18/21 1041

## 2021-02-18 NOTE — Discharge Instructions (Signed)

## 2021-02-19 ENCOUNTER — Encounter (HOSPITAL_COMMUNITY): Payer: Self-pay | Admitting: Cardiovascular Disease

## 2021-02-19 DIAGNOSIS — I2102 ST elevation (STEMI) myocardial infarction involving left anterior descending coronary artery: Secondary | ICD-10-CM | POA: Diagnosis not present

## 2021-02-19 LAB — CBC
HCT: 46.7 % (ref 39.0–52.0)
Hemoglobin: 15.9 g/dL (ref 13.0–17.0)
MCH: 29.8 pg (ref 26.0–34.0)
MCHC: 34 g/dL (ref 30.0–36.0)
MCV: 87.5 fL (ref 80.0–100.0)
Platelets: 209 10*3/uL (ref 150–400)
RBC: 5.34 MIL/uL (ref 4.22–5.81)
RDW: 13.5 % (ref 11.5–15.5)
WBC: 10.4 10*3/uL (ref 4.0–10.5)
nRBC: 0 % (ref 0.0–0.2)

## 2021-02-19 LAB — BASIC METABOLIC PANEL
Anion gap: 9 (ref 5–15)
BUN: 14 mg/dL (ref 8–23)
CO2: 19 mmol/L — ABNORMAL LOW (ref 22–32)
Calcium: 8.7 mg/dL — ABNORMAL LOW (ref 8.9–10.3)
Chloride: 111 mmol/L (ref 98–111)
Creatinine, Ser: 1.01 mg/dL (ref 0.61–1.24)
GFR, Estimated: >60 mL/min (ref >60)
Glucose, Bld: 100 mg/dL — ABNORMAL HIGH (ref 70–99)
Potassium: 3.9 mmol/L (ref 3.5–5.1)
Sodium: 139 mmol/L (ref 135–145)

## 2021-02-19 MED ORDER — ATORVASTATIN CALCIUM 80 MG PO TABS: 80.0000 mg | ORAL_TABLET | Freq: Every day | ORAL | 3 refills | Status: DC

## 2021-02-19 MED ORDER — CHLORHEXIDINE GLUCONATE CLOTH 2 % EX PADS: 6.0000 | MEDICATED_PAD | Freq: Every day | CUTANEOUS | Status: DC

## 2021-02-19 MED ORDER — METOPROLOL TARTRATE 25 MG PO TABS: 25.0000 mg | ORAL_TABLET | Freq: Two times a day (BID) | ORAL | 3 refills | Status: DC

## 2021-02-19 MED ORDER — ASPIRIN EC 81 MG PO TBEC: 81.0000 mg | DELAYED_RELEASE_TABLET | Freq: Every day | ORAL | 3 refills | Status: DC

## 2021-02-19 MED ORDER — NITROGLYCERIN 0.4 MG SL SUBL: 0.4000 mg | SUBLINGUAL_TABLET | SUBLINGUAL | 12 refills | Status: AC | PRN

## 2021-02-19 MED ORDER — TICAGRELOR 90 MG PO TABS: 90.0000 mg | ORAL_TABLET | Freq: Two times a day (BID) | ORAL | 3 refills | Status: DC

## 2021-02-19 NOTE — Progress Notes (Signed)
Progress Note  Patient Name: Melvin Faulkner Date of Encounter: 02/19/2021  [facility] Cardiologist: Dr Excell Seltzer  Subjective   No CP or dyspnea  Inpatient Medications    Scheduled Meds:  aspirin  81 mg Oral Daily   atorvastatin  80 mg Oral Daily   Chlorhexidine Gluconate Cloth  6 each Topical Daily   lisinopril  5 mg Oral Daily   metoprolol tartrate  25 mg Oral BID   sodium chloride flush  3 mL Intravenous Q12H   ticagrelor  90 mg Oral BID   Continuous Infusions:  sodium chloride     sodium chloride     PRN Meds: sodium chloride, acetaminophen, ondansetron (ZOFRAN) IV, sodium chloride flush   Vital Signs    Vitals:   02/19/21 0300 02/19/21 0400 02/19/21 0500 02/19/21 0600  BP: (!) 145/78 (!) 148/76 (!) 154/82 139/80  Pulse: (!) 53 (!) 48 (!) 51 (!) 56  Resp: 16 17 19 16   Temp:      TempSrc:      SpO2: 96% 96% 97% 97%  Weight:      Height:        Intake/Output Summary (Last 24 hours) at 02/19/2021 0652 Last data filed at 02/19/2021 0100 Gross per 24 hour  Intake 1316.92 ml  Output 1545 ml  Net -228.08 ml   Last 3 Weights 02/18/2021 05/23/2019 01/24/2018  Weight (lbs) 198 lb 205 lb 188 lb 9.6 oz  Weight (kg) 89.812 kg 92.987 kg 85.548 kg      Telemetry    Sinus with 3 beats NSVT - Personally Reviewed   Physical Exam   GEN: No acute distress.   Neck: No JVD Cardiac: RRR, no murmurs, rubs, or gallops.  Respiratory: Clear to auscultation bilaterally. GI: Soft, nontender, non-distended  MS: No edema, radial cath site with no hematoma. Neuro:  Nonfocal  Psych: Normal affect   Labs    High Sensitivity Troponin:   Recent Labs  Lab 02/18/21 0902 02/18/21 1309 02/18/21 1523  TROPONINIHS 31* 370* 771*     Chemistry Recent Labs  Lab 02/18/21 0902 02/18/21 0950 02/19/21 0448  NA 137 141 139  K 4.4 3.6 3.9  CL 105 105 111  CO2 23  --  19*  GLUCOSE 137* 114* 100*  BUN 16 16 14   CREATININE 1.05 0.80 1.01  CALCIUM 9.6  --  8.7*   PROT 7.5  --   --   ALBUMIN 4.1  --   --   AST 28  --   --   ALT 29  --   --   ALKPHOS 87  --   --   BILITOT 1.0  --   --   GFRNONAA >60  --  >60  ANIONGAP 9  --  9    Lipids  Recent Labs  Lab 02/18/21 0902  CHOL 212*  TRIG 87  HDL 44  LDLCALC 151*  CHOLHDL 4.8    Hematology Recent Labs  Lab 02/18/21 0902 02/18/21 0950 02/19/21 0448  WBC 8.8  --  10.4  RBC 5.89*  --  5.34  HGB 17.4* 14.6 15.9  HCT 52.1* 43.0 46.7  MCV 88.5  --  87.5  MCH 29.5  --  29.8  MCHC 33.4  --  34.0  RDW 13.3  --  13.5  PLT 240  --  209     Radiology    CARDIAC CATHETERIZATION  Result Date: 02/18/2021   Prox LAD to Mid LAD lesion is 95%  stenosed.   A drug-eluting stent was successfully placed using a STENT ONYX FRONTIER 3.0X18.   Post intervention, there is a 0% residual stenosis. Acute anterior ST elevation MI secondary to severe mid LAD stenosis Successful PTCA/DES x 1 mid LAD No obstructive disease in the RCA or Circumflex. Normal LVEDP Admit to ICU. Echo later today. DAPT with ASA and Brilinta for at least 12 months. Will start high intensity statin and a beta blocker. Likely d/c home tomorrow.   ECHOCARDIOGRAM COMPLETE  Result Date: 02/18/2021    ECHOCARDIOGRAM REPORT   Patient Name:   Melvin Faulkner Date of Exam: 02/18/2021 Medical Rec #:  [id number]               Height:       72.0 in Accession #:    [id number]              Weight:       198.0 lb Date of Birth:  1955-08-31               BSA:          2.121 m Patient Age:    65 years                BP:           167/89 mmHg Patient Gender: M                       HR:           57 bpm. Exam Location:  Inpatient Procedure: 2D Echo, Cardiac Doppler and Color Doppler Indications:    CAD Native Vessel I25.10  History:        Patient has no prior history of Echocardiogram examinations.                 Risk Factors:Hypertension.  Sonographer:    Eulah Pont RDCS Referring Phys: [id number] CHRISTOPHER D MCALHANY IMPRESSIONS  1. Left  ventricular ejection fraction, by estimation, is 60 to 65%. The left ventricle has normal function. The left ventricle has no regional wall motion abnormalities. Left ventricular diastolic parameters were normal.  2. Right ventricular systolic function is normal. The right ventricular size is normal.  3. Right atrial size was mildly dilated.  4. The mitral valve is normal in structure. No evidence of mitral valve regurgitation. No evidence of mitral stenosis.  5. The aortic valve is normal in structure. Aortic valve regurgitation is not visualized. No aortic stenosis is present. FINDINGS  Left Ventricle: Left ventricular ejection fraction, by estimation, is 60 to 65%. The left ventricle has normal function. The left ventricle has no regional wall motion abnormalities. The left ventricular internal cavity size was normal in size. There is  no left ventricular hypertrophy. Left ventricular diastolic parameters were normal. Right Ventricle: The right ventricular size is normal. Right vetricular wall thickness was not well visualized. Right ventricular systolic function is normal. Left Atrium: Left atrial size was normal in size. Right Atrium: Right atrial size was mildly dilated. Pericardium: There is no evidence of pericardial effusion. Mitral Valve: The mitral valve is normal in structure. No evidence of mitral valve regurgitation. No evidence of mitral valve stenosis. Tricuspid Valve: The tricuspid valve is grossly normal. Tricuspid valve regurgitation is not demonstrated. Aortic Valve: The aortic valve is normal in structure. Aortic valve regurgitation is not visualized. No aortic stenosis is present. Pulmonic Valve: The pulmonic valve was not well visualized. Pulmonic valve regurgitation is  not visualized. Aorta: The aortic root and ascending aorta are structurally normal, with no evidence of dilitation. IAS/Shunts: The atrial septum is grossly normal.  LEFT VENTRICLE PLAX 2D LVIDd:         5.50 cm   Diastology  LVIDs:         3.50 cm   LV e' medial:    7.28 cm/s LV PW:         1.20 cm   LV E/e' medial:  7.1 LV IVS:        1.00 cm   LV e' lateral:   8.55 cm/s LVOT diam:     2.00 cm   LV E/e' lateral: 6.1 LV SV:         72 LV SV Index:   34 LVOT Area:     3.14 cm  RIGHT VENTRICLE RV S prime:     13.20 cm/s TAPSE (M-mode): 2.2 cm LEFT ATRIUM             Index        RIGHT ATRIUM           Index LA diam:        3.50 cm 1.65 cm/m   RA Area:     19.10 cm LA Vol (A2C):   45.5 ml 21.45 ml/m  RA Volume:   58.10 ml  27.39 ml/m LA Vol (A4C):   46.8 ml 22.06 ml/m LA Biplane Vol: 48.6 ml 22.91 ml/m  AORTIC VALVE LVOT Vmax:   110.00 cm/s LVOT Vmean:  70.600 cm/s LVOT VTI:    0.230 m  AORTA Ao Root diam: 3.60 cm Ao Asc diam:  3.70 cm MITRAL VALVE MV Area (PHT): 1.94 cm    SHUNTS MV Decel Time: 391 msec    Systemic VTI:  0.23 m MV E velocity: 51.90 cm/s  Systemic Diam: 2.00 cm MV A velocity: 48.00 cm/s MV E/A ratio:  1.08 Kristeen Miss MD Electronically signed by Kristeen Miss MD Signature Date/Time: 02/18/2021/5:27:30 PM    Final      Patient Profile     65 y.o. male with past medical history of hypertension admitted with acute anterior myocardial infarction.  Cardiac catheterization revealed a 95% proximal LAD and patient had PCI.  Echocardiogram showed normal LV function.  Assessment & Plan    1 status post anterior infarct-patient is status post PCI of LAD.  Continue aspirin, Brilinta and Lipitor.  Continue metoprolol.  2 hypertension-blood pressure is running high.  Continue lisinopril and metoprolol.  He states his blood pressure is typically controlled at home.  We will follow and advance lisinopril if needed.  3 hyperlipidemia-continue Lipitor.  Will need lipids and liver check in 8 weeks.  Discharge today and follow-up with APP in 2 to 4 weeks and Dr. Excell Seltzer in 3 months.  Greater than 30 minutes PA and physician time. D2  For questions or updates, please contact [facility] Please consult  [email or web address] for contact info under        Signed, Olga Millers, MD  02/19/2021, 6:52 AM

## 2021-02-19 NOTE — Progress Notes (Signed)
Pt HR has been steadily decreasing since start of shift. Pt remains asymptomatic, A/Ox4, and SBP is between 135-147. Cardiology notified. Awaiting response.  02/19/21 0200  Vitals  BP (!) 142/86  MAP (mmHg) 103  Pulse Rate (!) 50  ECG Heart Rate (!) 50  Resp 17  Oxygen Therapy  SpO2 96 %  MEWS Score  MEWS Temp 0  MEWS Systolic 0  MEWS Pulse 1  MEWS RR 0  MEWS LOC 0  MEWS Score 1  MEWS Score Color Green

## 2021-02-19 NOTE — Discharge Summary (Signed)
Discharge Summary    Patient ID: Melvin Faulkner MRN: [id number]; DOB: 10-28-1955  Admit date: 02/18/2021 Discharge date: 02/19/2021  PCP:  Oneita Hurt, No   [facility] Providers Cardiologist:  Tonny Bollman, MD   {  Discharge Diagnoses    Principal Problem:   Acute ST elevation myocardial infarction (STEMI) involving left anterior descending (LAD) coronary artery [facility]) Active Problems:   Essential hypertension   Hyperlipemia   Acute ST elevation myocardial infarction (STEMI) of inferior wall (HCC)   Acute ST elevation myocardial infarction (STEMI) involving left anterior descending coronary artery (HCC)   Diagnostic Studies/Procedures    Echo 02/18/21 1. Left ventricular ejection fraction, by estimation, is 60 to 65%. The  left ventricle has normal function. The left ventricle has no regional  wall motion abnormalities. Left ventricular diastolic parameters were  normal.   2. Right ventricular systolic function is normal. The right ventricular  size is normal.   3. Right atrial size was mildly dilated.   4. The mitral valve is normal in structure. No evidence of mitral valve  regurgitation. No evidence of mitral stenosis.   5. The aortic valve is normal in structure. Aortic valve regurgitation is  not visualized. No aortic stenosis is present.   Coronary/Graft Acute MI Revascularization    Intravascular Ultrasound/IVUS  LEFT HEART CATH AND CORONARY ANGIOGRAPHY   Conclusion      Prox LAD to Mid LAD lesion is 95% stenosed.   A drug-eluting stent was successfully placed using a STENT ONYX FRONTIER 3.0X18.   Post intervention, there is a 0% residual stenosis.   Acute anterior ST elevation MI secondary to severe mid LAD stenosis Successful PTCA/DES x 1 mid LAD No obstructive disease in the RCA or Circumflex.  Normal LVEDP   Admit to ICU. Echo later today. DAPT with ASA and Brilinta for at least 12 months. Will start high intensity statin and a beta blocker.  Likely d/c home tomorrow.     Diagnostic Dominance: Right Intervention   History of Present Illness     Melvin Faulkner is a 65 y.o. male with HTN who is being seen 02/18/2021 for the evaluation of STEMI.   He was exercising on the treadmill and at about the 2 mile mark he began to have substernal chest discomfort.  He stopped exercising but his pain intensified.  He presented to the emergency department via EMS.  EKG suggested inferior lateral STE and a code STEMI is paged out.  The patient is noted to be severely hypertensive on presentation.  At the time of evaluation, his pain has significantly improved and he is complaining of very mild residual discomfort on the left chest.  He denies diaphoresis, nausea, vomiting, or shortness of breath.  He denies any prodromal symptoms.  The patient does not smoke.  He denies a family history of premature CAD.  He reports well-controlled blood pressure on lisinopril.  Hospital Course     Consultants: None   CAD/STEMI Emergent cath showed severe mid LAD stenosis s/p successful PTCA/DES x 1 mid LAD, otherwise no obstructive disease. DAPT with ASA and Brilinta for at least 12 months. No complications. No pain overnight. Echo showed LVEF of 60-65%. No WM abnormality. Will Ambulate with cardiac rehab. Continue Statin, BB and lisinopril.   2. HTN - Intermittently elevated -Continue home Lisinopril - Added BB  3. HLD - 02/18/2021: Cholesterol 212; HDL 44; LDL Cholesterol 151; Triglycerides 87; VLDL 17  - Continue statin  - LDL goal less than 70 -  02/18/2021: Cholesterol 212; HDL 44; LDL Cholesterol 151; Triglycerides 87; VLDL 17     Did the patient have an acute coronary syndrome (MI, NSTEMI, STEMI, etc) this admission?:  Yes                               AHA/ACC Clinical Performance & Quality Measures: Aspirin prescribed? - Yes ADP Receptor Inhibitor (Plavix/Clopidogrel, Brilinta/Ticagrelor or Effient/Prasugrel) prescribed (includes  medically managed patients)? - Yes Beta Blocker prescribed? - Yes High Intensity Statin (Lipitor 40-80mg  or Crestor 20-40mg ) prescribed? - Yes EF assessed during THIS hospitalization? - Yes For EF <40%, was ACEI/ARB prescribed? - Yes For EF <40%, Aldosterone Antagonist (Spironolactone or Eplerenone) prescribed? - Not Applicable (EF >/= 40%) Cardiac Rehab Phase II ordered (including medically managed patients)? - Yes       The patient will be scheduled for a TOC follow up appointment in (office will call) days.  A message has been sent to the [facility] and Scheduling Pool at the office where the patient should be seen for follow up.    Discharge Vitals Blood pressure (!) 165/79, pulse 62, temperature 98.3 F (36.8 C), temperature source Oral, resp. rate (!) 23, height 6' (1.829 m), weight 89.8 kg, SpO2 95 %.  Filed Weights   02/18/21 0859  Weight: 89.8 kg    Labs & Radiologic Studies    CBC Recent Labs    02/18/21 0902 02/18/21 0950 02/19/21 0448  WBC 8.8  --  10.4  NEUTROABS 6.2  --   --   HGB 17.4* 14.6 15.9  HCT 52.1* 43.0 46.7  MCV 88.5  --  87.5  PLT 240  --  209   Basic Metabolic Panel Recent Labs    16/10/96 0902 02/18/21 0950 02/19/21 0448  NA 137 141 139  K 4.4 3.6 3.9  CL 105 105 111  CO2 23  --  19*  GLUCOSE 137* 114* 100*  BUN CREATININE 1.05 0.80 1.01  CALCIUM 9.6  --  8.7*   Liver Function Tests Recent Labs    02/18/21 0902  AST 28  ALT 29  ALKPHOS 87  BILITOT 1.0  PROT 7.5  ALBUMIN 4.1   No results for input(s): LIPASE, AMYLASE in the last 72 hours. High Sensitivity Troponin:   Recent Labs  Lab 02/18/21 0902 02/18/21 1309 02/18/21 1523  TROPONINIHS 31* 370* 771*     Hemoglobin A1C Recent Labs    02/18/21 0902  HGBA1C 5.7*   Fasting Lipid Panel Recent Labs    02/18/21 0902  CHOL 212*  HDL 44  LDLCALC 151*  TRIG 87  CHOLHDL 4.8    _____________  CARDIAC CATHETERIZATION  Result Date: 02/18/2021   Prox LAD  to Mid LAD lesion is 95% stenosed.   A drug-eluting stent was successfully placed using a STENT ONYX FRONTIER 3.0X18.   Post intervention, there is a 0% residual stenosis. Acute anterior ST elevation MI secondary to severe mid LAD stenosis Successful PTCA/DES x 1 mid LAD No obstructive disease in the RCA or Circumflex. Normal LVEDP Admit to ICU. Echo later today. DAPT with ASA and Brilinta for at least 12 months. Will start high intensity statin and a beta blocker. Likely d/c home tomorrow.   ECHOCARDIOGRAM COMPLETE  Result Date: 02/18/2021    ECHOCARDIOGRAM REPORT   Patient Name:   Melvin Faulkner Date of Exam: 02/18/2021 Medical Rec #:  [id number]  Height:       72.0 in Accession #:    [id number]              Weight:       198.0 lb Date of Birth:  [birth year]               BSA:          2.121 m Patient Age:    65 years                BP:           167/89 mmHg Patient Gender: M                       HR:           57 bpm. Exam Location:  Inpatient Procedure: 2D Echo, Cardiac Doppler and Color Doppler Indications:    CAD Native Vessel I25.10  History:        Patient has no prior history of Echocardiogram examinations.                 Risk Factors:Hypertension.  Sonographer:    Eulah Pont RDCS Referring Phys: [id number] CHRISTOPHER D MCALHANY IMPRESSIONS  1. Left ventricular ejection fraction, by estimation, is 60 to 65%. The left ventricle has normal function. The left ventricle has no regional wall motion abnormalities. Left ventricular diastolic parameters were normal.  2. Right ventricular systolic function is normal. The right ventricular size is normal.  3. Right atrial size was mildly dilated.  4. The mitral valve is normal in structure. No evidence of mitral valve regurgitation. No evidence of mitral stenosis.  5. The aortic valve is normal in structure. Aortic valve regurgitation is not visualized. No aortic stenosis is present. FINDINGS  Left Ventricle: Left ventricular ejection  fraction, by estimation, is 60 to 65%. The left ventricle has normal function. The left ventricle has no regional wall motion abnormalities. The left ventricular internal cavity size was normal in size. There is  no left ventricular hypertrophy. Left ventricular diastolic parameters were normal. Right Ventricle: The right ventricular size is normal. Right vetricular wall thickness was not well visualized. Right ventricular systolic function is normal. Left Atrium: Left atrial size was normal in size. Right Atrium: Right atrial size was mildly dilated. Pericardium: There is no evidence of pericardial effusion. Mitral Valve: The mitral valve is normal in structure. No evidence of mitral valve regurgitation. No evidence of mitral valve stenosis. Tricuspid Valve: The tricuspid valve is grossly normal. Tricuspid valve regurgitation is not demonstrated. Aortic Valve: The aortic valve is normal in structure. Aortic valve regurgitation is not visualized. No aortic stenosis is present. Pulmonic Valve: The pulmonic valve was not well visualized. Pulmonic valve regurgitation is not visualized. Aorta: The aortic root and ascending aorta are structurally normal, with no evidence of dilitation. IAS/Shunts: The atrial septum is grossly normal.  LEFT VENTRICLE PLAX 2D LVIDd:         5.50 cm   Diastology LVIDs:         3.50 cm   LV e' medial:    7.28 cm/s LV PW:         1.20 cm   LV E/e' medial:  7.1 LV IVS:        1.00 cm   LV e' lateral:   8.55 cm/s LVOT diam:     2.00 cm   LV E/e' lateral: 6.1 LV SV:  72 LV SV Index:   34 LVOT Area:     3.14 cm  RIGHT VENTRICLE RV S prime:     13.20 cm/s TAPSE (M-mode): 2.2 cm LEFT ATRIUM             Index        RIGHT ATRIUM           Index LA diam:        3.50 cm 1.65 cm/m   RA Area:     19.10 cm LA Vol (A2C):   45.5 ml 21.45 ml/m  RA Volume:   58.10 ml  27.39 ml/m LA Vol (A4C):   46.8 ml 22.06 ml/m LA Biplane Vol: 48.6 ml 22.91 ml/m  AORTIC VALVE LVOT Vmax:   110.00 cm/s LVOT  Vmean:  70.600 cm/s LVOT VTI:    0.230 m  AORTA Ao Root diam: 3.60 cm Ao Asc diam:  3.70 cm MITRAL VALVE MV Area (PHT): 1.94 cm    SHUNTS MV Decel Time: 391 msec    Systemic VTI:  0.23 m MV E velocity: 51.90 cm/s  Systemic Diam: 2.00 cm MV A velocity: 48.00 cm/s MV E/A ratio:  1.08 Kristeen Miss MD Electronically signed by Kristeen Miss MD Signature Date/Time: 02/18/2021/5:27:30 PM    Final    Disposition   Pt is being discharged home today in good condition.  Follow-up Plans & Appointments     Follow-up Information     Tonny Bollman, MD Follow up.   Specialty: Cardiology Why: Office will call with date and time of appointment Contact information: [address] [place] [address] [phone]                Discharge Instructions     Diet - low sodium heart healthy   Complete by: As directed    Discharge instructions   Complete by: As directed    No driving for 5 weeks. No lifting over 10 lbs for 4 weeks. No sexual activity for 4 weeks. You may not return to work until cleared by your cardiologist. Keep procedure site clean & dry. If you notice increased pain, swelling, bleeding or pus, call/return!  You may shower, but no soaking baths/hot tubs/pools for 1 week.   Increase activity slowly   Complete by: As directed        Discharge Medications   Allergies as of 02/19/2021   No Known Allergies      Medication List     TAKE these medications    aspirin EC 81 MG tablet Take 1 tablet (81 mg total) by mouth daily. Swallow whole.   atorvastatin 80 MG tablet Commonly known as: LIPITOR Take 1 tablet (80 mg total) by mouth daily.   lisinopril 5 MG tablet Commonly known as: ZESTRIL TAKE 1 TABLET(5 MG) BY MOUTH DAILY   metoprolol tartrate 25 MG tablet Commonly known as: LOPRESSOR Take 1 tablet (25 mg total) by mouth 2 (two) times daily.   nitroGLYCERIN 0.4 MG SL tablet Commonly known as: Nitrostat Place 1 tablet (0.4 mg total) under  the tongue every 5 (five) minutes as needed.   ticagrelor 90 MG Tabs tablet Commonly known as: BRILINTA Take 1 tablet (90 mg total) by mouth 2 (two) times daily.   zolpidem 5 MG tablet Commonly known as: AMBIEN Take 1 tablet (5 mg total) by mouth at bedtime as needed for sleep.         Outstanding Labs/Studies   Consider OP f/u labs 6-8  weeks given statin initiation this admission.   Duration of Discharge Encounter   Greater than 30 minutes including physician time.  Signed, Manson Passey, PA 02/19/2021, 8:25 AM

## 2021-02-19 NOTE — Progress Notes (Signed)
CARDIAC REHAB PHASE I   PRE:  Rate/Rhythm: 60 SR  BP:  Supine:   Sitting: 145/85  Standing:    SaO2: 95 RA  MODE:  Ambulation: 740 ft   POST:  Rate/Rhythm: 76 SR  BP:  Supine:   Sitting: 164/74  Standing:    SaO2: 97 RA [id number]  Pt tolerated ambulation well without c/o of cp or SOB. VS stable. Pt to recliner after walk with call light in reach. Completed MI and stent education with pt.Gave him MI booklet and stent card. We discussed MI , stent, risk factors, modifications, heart healthy diet, exercise guidelines, proper use of sL NTG and calling 911 and Outpt. CRP. I strongly encouraged compliance with his medications and to call MD office if he has any issues with taking them.He voices understanding. Will send referral to Outpt. CRP in [place].  Melina Copa RN 02/19/2021 9:19 AM

## 2021-02-21 ENCOUNTER — Other Ambulatory Visit: Payer: Self-pay | Admitting: *Deleted

## 2021-02-21 MED ORDER — LISINOPRIL 5 MG PO TABS
5.0000 mg | ORAL_TABLET | Freq: Every day | ORAL | 3 refills | Status: DC
Start: 2021-02-21 — End: 2022-05-04

## 2021-02-23 ENCOUNTER — Telehealth: Payer: Self-pay | Admitting: *Deleted

## 2021-02-23 NOTE — Telephone Encounter (Signed)
Left message for patient about participating in research study v-inception

## 2021-02-28 ENCOUNTER — Telehealth (HOSPITAL_COMMUNITY): Payer: Self-pay

## 2021-02-28 NOTE — Telephone Encounter (Signed)
Attempted to call patient in regards to Cardiac Rehab - LM on VM 

## 2021-03-08 NOTE — Progress Notes (Signed)
Office Visit    Patient Name: Erian Rosengren Date of Encounter: 03/09/2021  PCP:  [facility]  Cardiologist:  Tonny Bollman, MD  Advanced Practice Provider:  No care team member to display Electrophysiologist:  None    Chief Complaint    Davier Tramell is a 65 y.o. male with a hx of HTN presents today for hospital follow-up status post STEMI with PCI.  Past Medical History    Past Medical History:  Diagnosis Date   Bereavement    lost son 2010   Frequent headaches    History of chicken pox    History of kidney stones    Hypertension    Insomnia    Migraine    Past Surgical History:  Procedure Laterality Date   CORONARY/GRAFT ACUTE MI REVASCULARIZATION N/A 02/18/2021   Procedure: Coronary/Graft Acute MI Revascularization;  Surgeon: Kathleene Hazel, MD;  Location: [facility] INVASIVE CV LAB;  Service: Cardiovascular;  Laterality: N/A;   INTRAVASCULAR ULTRASOUND/IVUS N/A 02/18/2021   Procedure: Intravascular Ultrasound/IVUS;  Surgeon: Kathleene Hazel, MD;  Location: [facility] INVASIVE CV LAB;  Service: Cardiovascular;  Laterality: N/A;   LEFT HEART CATH AND CORONARY ANGIOGRAPHY N/A 02/18/2021   Procedure: LEFT HEART CATH AND CORONARY ANGIOGRAPHY;  Surgeon: Kathleene Hazel, MD;  Location: [facility] INVASIVE CV LAB;  Service: Cardiovascular;  Laterality: N/A;    Allergies  No Known Allergies  History of Present Illness    Benno Brensinger is a 65 y.o. male with a hx of HTN, STEMI status post PCI last seen during his hospitalization by Manson Passey,  PA.  He is exercising on the treadmill and at about the 2 mile mark he began to have substernal chest discomfort.  He stopped exercising but his pain intensified.  He presented on 02/19/2021 to the emergency department via EMS.  EKG suggested inferior lateral ST and a code STEMI was paged out.  The patient was noted to be very hypertensive on presentation.  At  the time of evaluation, his pain had significantly improved and he was complaining of very mild residual discomfort in his left chest.  He denies diaphoresis, nausea, vomiting, or shortness of breath.  He denies any prodromal symptoms.  The patient does not smoke.  He has a family history of premature CAD.  He reports well-controlled blood pressure on lisinopril.  While in the hospital, emergent catheterization showed severe mid LAD stenosis status post successful PTCA/DES x1 mid LAD, otherwise no obstructive disease.  DAPT with ASA and Brilinta for at least 12 months was initiated.  At the time his hypertension was treated with his home dose of lisinopril and a beta-blocker was added.  Today, he feels good without any chest pain or shortness of breath. He went on a 3 mile hike this morning and did not have any symptoms. He wants to get back to running. He was running 2 miles a day before his procedure. He also is interested in starting a lifting program. He is okay to slowly increase his weight limit from the 10 lbs. We reviewed all his medications and the reason for taking them. He voiced understanding. We plan to check his LDL after 6 weeks of being on his Lipitor as well as LFTs.   Reports no shortness of breath nor dyspnea on exertion. Reports no chest pain, pressure, or tightness. No edema, orthopnea, PND. Reports no palpitations.     EKGs/Labs/Other Studies Reviewed:   The following  studies were reviewed today:  Echo 02/18/21 1. Left ventricular ejection fraction, by estimation, is 60 to 65%. The  left ventricle has normal function. The left ventricle has no regional  wall motion abnormalities. Left ventricular diastolic parameters were  normal.   2. Right ventricular systolic function is normal. The right ventricular  size is normal.   3. Right atrial size was mildly dilated.   4. The mitral valve is normal in structure. No evidence of mitral valve  regurgitation. No evidence of mitral  stenosis.   5. The aortic valve is normal in structure. Aortic valve regurgitation is  not visualized. No aortic stenosis is present.    Coronary/Graft Acute MI Revascularization    Intravascular Ultrasound/IVUS  LEFT HEART CATH AND CORONARY ANGIOGRAPHY    Conclusion       Prox LAD to Mid LAD lesion is 95% stenosed.   A drug-eluting stent was successfully placed using a STENT ONYX FRONTIER 3.0X18.   Post intervention, there is a 0% residual stenosis.   Acute anterior ST elevation MI secondary to severe mid LAD stenosis Successful PTCA/DES x 1 mid LAD No obstructive disease in the RCA or Circumflex.  Normal LVEDP   Admit to ICU. Echo later today. DAPT with ASA and Brilinta for at least 12 months. Will start high intensity statin and a beta blocker. Likely d/c home tomorrow.     Diagnostic Dominance: Right Intervention     EKG:  EKG is ordered today.  The ekg ordered today demonstrates sinus bradycardia rate 59.   Recent Labs: 02/18/2021: ALT 29 02/19/2021: BUN 14; Creatinine, Ser 1.01; Hemoglobin 15.9; Platelets 209; Potassium 3.9; Sodium 139  Recent Lipid Panel    Component Value Date/Time   CHOL 212 (H) 02/18/2021 0902   TRIG 87 02/18/2021 0902   HDL 44 02/18/2021 0902   CHOLHDL 4.8 02/18/2021 0902   VLDL 17 02/18/2021 0902   LDLCALC 151 (H) 02/18/2021 0902     Home Medications   Current Meds  Medication Sig   aspirin EC 81 MG tablet Take 1 tablet (81 mg total) by mouth daily. Swallow whole.   atorvastatin (LIPITOR) 80 MG tablet Take 1 tablet (80 mg total) by mouth daily.   lisinopril (ZESTRIL) 5 MG tablet Take 1 tablet (5 mg total) by mouth daily.   metoprolol tartrate (LOPRESSOR) 25 MG tablet Take 1 tablet (25 mg total) by mouth 2 (two) times daily.   nitroGLYCERIN (NITROSTAT) 0.4 MG SL tablet Place 1 tablet (0.4 mg total) under the tongue every 5 (five) minutes as needed.   ticagrelor (BRILINTA) 90 MG TABS tablet Take 1 tablet (90 mg total) by mouth 2 (two)  times daily.   zolpidem (AMBIEN) 5 MG tablet Take 1 tablet (5 mg total) by mouth at bedtime as needed for sleep.     Review of Systems     All other systems reviewed and are otherwise negative except as noted above.  Physical Exam    VS:  BP 110/70 (BP Location: Left Arm, Patient Position: Sitting, Cuff Size: Normal)   Pulse (!) 59   Ht 6' (1.829 m)   Wt 195 lb (88.5 kg)   BMI 26.45 kg/m  , BMI Body mass index is 26.45 kg/m.  Wt Readings from Last 3 Encounters:  03/09/21 195 lb (88.5 kg)  02/18/21 198 lb (89.8 kg)  05/23/19 205 lb (93 kg)     GEN: Well nourished, well developed, in no acute distress. HEENT: normal. Cardiac: RRR, no murmurs, rubs,  or gallops. No clubbing, cyanosis, edema.  Radials/PT 2+ and equal bilaterally. Cath site healing well without hematoma.  Respiratory:  Respirations regular and unlabored, clear to auscultation bilaterally. GI: Soft, nontender, nondistended. MS: No deformity or atrophy. Skin: Warm and dry, no rash. Neuro:  Strength and sensation are intact. Psych: Normal affect.  Assessment & Plan    CAD/STEMI status post PTCA/DES x1 mid LAD-GDMT: Aspirin, Brilinta, statin, metoprolol tartrate, and lisinopril.  Plan to continue dual antiplatelet for 12 months.No further chest pain or shortness of breath.  Hypertension-BP well controlled. Continue current antihypertensive regimen.  Continue lisinopril.  Labs done while admitted showed stable renal function. Recommended Omron cuff. His purchased cuff gave a much higher reading today compared to ours.   Hyperlipidemia-continue statin therapy. LDL 151 while on max dose statin therapy. Repeat lipid panel and LFTs in 4 weeks. If LDL not at goal of less then 70 will consider zetia or PCSK9i.    GERD-currently not on any daily treatment. Tums as needed.   Insomnia- Continue Ambien PRN  Disposition: Follow up 3 months with Tonny Bollman, MD or APP.    Cardiac Rehabilitation Eligibility Assessment   The patient is ready to start cardiac rehabilitation from a cardiac standpoint.     Signed, Sharlene Dory, PA-C 03/09/2021, 3:09 PM [facility]

## 2021-03-09 ENCOUNTER — Encounter (HOSPITAL_BASED_OUTPATIENT_CLINIC_OR_DEPARTMENT_OTHER): Payer: Self-pay | Admitting: Physician Assistant

## 2021-03-09 ENCOUNTER — Other Ambulatory Visit: Payer: Self-pay

## 2021-03-09 ENCOUNTER — Ambulatory Visit (HOSPITAL_BASED_OUTPATIENT_CLINIC_OR_DEPARTMENT_OTHER): Payer: BLUE CROSS/BLUE SHIELD | Admitting: Physician Assistant

## 2021-03-09 VITALS — BP 110/70 | HR 59 | Ht 72.0 in | Wt 195.0 lb

## 2021-03-09 DIAGNOSIS — K219 Gastro-esophageal reflux disease without esophagitis: Secondary | ICD-10-CM

## 2021-03-09 DIAGNOSIS — I10 Essential (primary) hypertension: Secondary | ICD-10-CM | POA: Diagnosis not present

## 2021-03-09 DIAGNOSIS — E782 Mixed hyperlipidemia: Secondary | ICD-10-CM

## 2021-03-09 DIAGNOSIS — I2510 Atherosclerotic heart disease of native coronary artery without angina pectoris: Secondary | ICD-10-CM | POA: Diagnosis not present

## 2021-03-09 DIAGNOSIS — G4700 Insomnia, unspecified: Secondary | ICD-10-CM

## 2021-03-09 NOTE — Patient Instructions (Signed)
Medication Instructions:  Your Physician recommend you continue on your current medication as directed.    *If you need a refill on your cardiac medications before your next appointment, please call your pharmacy*   Lab Work: Your physician recommends that you return for lab work in:  Please return in 4 weeks for lab work. You may come to the [facility] location, [facility] at [place] or any [facility] location.  If you have labs (blood work) drawn today and your tests are completely normal, you will receive your results only by: MyChart Message (if you have MyChart) OR A paper copy in the mail If you have any lab test that is abnormal or we need to change your treatment, we will call you to review the results.   Testing/Procedures: None today    Follow-Up: At [facility], you and your health needs are our priority.  As part of our continuing mission to provide you with exceptional heart care, we have created designated Provider Care Teams.  These Care Teams include your primary Cardiologist (physician) and Advanced Practice Providers (APPs -  Physician Assistants and Nurse Practitioners) who all work together to provide you with the care you need, when you need it.  We recommend signing up for the patient portal called "MyChart".  Sign up information is provided on this After Visit Summary.  MyChart is used to connect with patients for Virtual Visits (Telemedicine).  Patients are able to view lab/test results, encounter notes, upcoming appointments, etc.  Non-urgent messages can be sent to your provider as well.   To learn more about what you can do with MyChart, go to [email or web address].    Your next appointment:   3 month(s)  The format for your next appointment:   In Person  Provider:   Tonny Bollman, MD  or Gillian Shields, NP If MD is not listed, click here to update    :1}    Other Instructions Gradual increase back to your usually activity.    For  coronary artery disease often called "heart disease" we aim for optimal guideline directed medical therapy. We use the "A, B, C"s to help keep [place] on track!  A = Aspirin 81mg  daily B = Beta blocker which helps to relax the heart. This is your Metoprolol. C = Cholesterol control. You take Atorvastatin to help control your cholesterol.  D = Don't forget nitroglycerin! This is an emergency tablet to be used if you have chest pain. E = Extras. In your case, this is your Brilinta for at least 1 year after stent.   Mediterranean Diet A Mediterranean diet refers to food and lifestyle choices that are based on the traditions of countries located on the . It focuses on eating more fruits, vegetables, whole grains, beans, nuts, seeds, and heart-healthy fats, and eating less dairy, meat, eggs, and processed foods with added sugar, salt, and fat. This way of eating has been shown to help prevent certain conditions and improve outcomes for people who have chronic diseases, like kidney disease and heart disease. What are tips for following this plan? Reading food labels Check the serving size of packaged foods. For foods such as rice and pasta, the serving size refers to the amount of cooked product, not dry. Check the total fat in packaged foods. Avoid foods that have saturated fat or trans fats. Check the ingredient list for added sugars, such as corn syrup. Shopping  Buy a variety of foods that offer a balanced diet,  including: Fresh fruits and vegetables (produce). Grains, beans, nuts, and seeds. Some of these may be available in unpackaged forms or large amounts (in bulk). Fresh seafood. Poultry and eggs. Low-fat dairy products. Buy whole ingredients instead of prepackaged foods. Buy fresh fruits and vegetables in-season from local farmers markets. Buy plain frozen fruits and vegetables. If you do not have access to quality fresh seafood, buy precooked frozen shrimp or canned fish,  such as tuna, salmon, or sardines. Stock your pantry so you always have certain foods on hand, such as olive oil, canned tuna, canned tomatoes, rice, pasta, and beans. Cooking Cook foods with extra-virgin olive oil instead of using butter or other vegetable oils. Have meat as a side dish, and have vegetables or grains as your main dish. This means having meat in small portions or adding small amounts of meat to foods like pasta or stew. Use beans or vegetables instead of meat in common dishes like chili or lasagna. Experiment with different cooking methods. Try roasting, broiling, steaming, and sauting vegetables. Add frozen vegetables to soups, stews, pasta, or rice. Add nuts or seeds for added healthy fats and plant protein at each meal. You can add these to yogurt, salads, or vegetable dishes. Marinate fish or vegetables using olive oil, lemon juice, garlic, and fresh herbs. Meal planning Plan to eat one vegetarian meal one day each week. Try to work up to two vegetarian meals, if possible. Eat seafood two or more times a week. Have healthy snacks readily available, such as: Vegetable sticks with hummus. Greek yogurt. Fruit and nut trail mix. Eat balanced meals throughout the week. This includes: Fruit: 2-3 servings a day. Vegetables: 4-5 servings a day. Low-fat dairy: 2 servings a day. Fish, poultry, or lean meat: 1 serving a day. Beans and legumes: 2 or more servings a week. Nuts and seeds: 1-2 servings a day. Whole grains: 6-8 servings a day. Extra-virgin olive oil: 3-4 servings a day. Limit red meat and sweets to only a few servings a month. Lifestyle  Cook and eat meals together with your family, when possible. Drink enough fluid to keep your urine pale yellow. Be physically active every day. This includes: Aerobic exercise like running or swimming. Leisure activities like gardening, walking, or housework. Get 7-8 hours of sleep each night. If recommended by your health  care provider, drink red wine in moderation. This means 1 glass a day for nonpregnant women and 2 glasses a day for men. A glass of wine equals 5 oz (150 mL). What foods should I eat? Fruits Apples. Apricots. Avocado. Berries. Bananas. Cherries. Dates. Figs. Grapes. Lemons. Melon. Oranges. Peaches. Plums. Pomegranate. Vegetables Artichokes. Beets. Broccoli. Cabbage. Carrots. Eggplant. Green beans. Chard. Kale. Spinach. Onions. Leeks. Peas. Squash. Tomatoes. Peppers. Radishes. Grains Whole-grain pasta. Brown rice. Bulgur wheat. Polenta. Couscous. Whole-wheat bread. Orpah Cobb. Meats and other proteins Beans. Almonds. Sunflower seeds. Pine nuts. Peanuts. Cod. Salmon. Scallops. Shrimp. Tuna. Tilapia. Clams. Oysters. Eggs. Poultry without skin. Dairy Low-fat milk. Cheese. Greek yogurt. Fats and oils Extra-virgin olive oil. Avocado oil. Grapeseed oil. Beverages Water. Red wine. Herbal tea. Sweets and desserts Greek yogurt with honey. Baked apples. Poached pears. Trail mix. Seasonings and condiments Basil. Cilantro. Coriander. Cumin. Mint. Parsley. Sage. Rosemary. Tarragon. Garlic. Oregano. Thyme. Pepper. Balsamic vinegar. Tahini. Hummus. Tomato sauce. Olives. Mushrooms. The items listed above may not be a complete list of foods and beverages you can eat. Contact a dietitian for more information. What foods should I limit? This is a list of  foods that should be eaten rarely or only on special occasions. Fruits Fruit canned in syrup. Vegetables Deep-fried potatoes (french fries). Grains Prepackaged pasta or rice dishes. Prepackaged cereal with added sugar. Prepackaged snacks with added sugar. Meats and other proteins Beef. Pork. Lamb. Poultry with skin. Hot dogs. Tomasa Blase. Dairy Ice cream. Sour cream. Whole milk. Fats and oils Butter. Canola oil. Vegetable oil. Beef fat (tallow). Lard. Beverages Juice. Sugar-sweetened soft drinks. Beer. Liquor and spirits. Sweets and  desserts Cookies. Cakes. Pies. Candy. Seasonings and condiments Mayonnaise. Pre-made sauces and marinades. The items listed above may not be a complete list of foods and beverages you should limit. Contact a dietitian for more information. Summary The Mediterranean diet includes both food and lifestyle choices. Eat a variety of fresh fruits and vegetables, beans, nuts, seeds, and whole grains. Limit the amount of red meat and sweets that you eat. If recommended by your health care provider, drink red wine in moderation. This means 1 glass a day for nonpregnant women and 2 glasses a day for men. A glass of wine equals 5 oz (150 mL). This information is not intended to replace advice given to you by your health care provider. Make sure you discuss any questions you have with your health care provider. Document Revised: 05/16/2019 Document Reviewed: 03/13/2019 Elsevier Patient Education  2022 [employer].

## 2021-03-22 NOTE — Telephone Encounter (Signed)
Called and spoke with pt in regards to CR, pt stated he is not interested at this time.   Closed referral 

## 2021-06-09 ENCOUNTER — Ambulatory Visit (HOSPITAL_BASED_OUTPATIENT_CLINIC_OR_DEPARTMENT_OTHER): Payer: BLUE CROSS/BLUE SHIELD | Admitting: Family

## 2021-06-15 ENCOUNTER — Ambulatory Visit (HOSPITAL_BASED_OUTPATIENT_CLINIC_OR_DEPARTMENT_OTHER): Payer: BLUE CROSS/BLUE SHIELD | Admitting: Family

## 2021-07-14 ENCOUNTER — Ambulatory Visit (HOSPITAL_BASED_OUTPATIENT_CLINIC_OR_DEPARTMENT_OTHER): Payer: BLUE CROSS/BLUE SHIELD | Admitting: Family

## 2021-08-16 NOTE — Progress Notes (Deleted)
Cardiology Office Note:    Date:  08/16/2021   ID:  Melvin Faulkner, DOB December 13, 1955, MRN [id number]  PCP:  Kathyrn Lass   [facility] Providers Cardiologist:  Sherren Mocha, MD { Click to update primary MD,subspecialty MD or APP then REFRESH:1}    Referring MD: No ref. provider found   Follow-up for coronary artery disease and hypertension  History of Present Illness:    Melvin Faulkner is a 66 y.o. male with a hx of coronary artery disease, inferior STEMI, hypertension, hyperlipidemia, insomnia, and stress at work.  He reports that he was exercising on his treadmill and noted substernal chest discomfort.  When he stopped exercising his pain increased.  He presented to the emergency department 10/22 via EMS.  His EKG showed lateral ST and inferior elevation.  He was hypertensive.  At the time of evaluation his pain had significantly improved.  He denied diaphoresis, nausea, vomiting, and shortness of breath.  He denied prodromal symptoms.  He reported a family history of premature coronary artery disease.  He reported that his blood pressure has been well controlled on his lisinopril.  He underwent cardiac catheterization which showed severe mid LAD stenosis.  He received PCI with DES x1 to his mid LAD and was not noted to have other obstructive disease.  He was placed on aspirin and Brilinta.  Beta-blocker therapy was added to his medication regimen.  He was seen in follow-up by Nicholes Rough, PA-C 03/09/2021.  During that time he reported no chest pain or shortness of breath.  He had been on a 3 mile hike the morning of his visit.  He wanted to get back to running.  He had been running 2 miles a day prior to his procedure.  He was also interested in resistance training.  He was given permission to slowly increase physical activity.  I plan was made for repeat lipid check in 6 weeks as well as LFTs.  He presents to the clinic today for follow-up evaluation and states***  ***  denies chest pain, shortness of breath, lower extremity edema, fatigue, palpitations, melena, hematuria, hemoptysis, diaphoresis, weakness, presyncope, syncope, orthopnea, and PND.   Past Medical History:  Diagnosis Date   Bereavement    lost son 2010   Frequent headaches    History of chicken pox    History of kidney stones    Hypertension    Insomnia    Migraine     Past Surgical History:  Procedure Laterality Date   CORONARY/GRAFT ACUTE MI REVASCULARIZATION N/A 02/18/2021   Procedure: Coronary/Graft Acute MI Revascularization;  Surgeon: Burnell Blanks, MD;  Location: [place] [facility];  Service: Cardiovascular;  Laterality: N/A;   INTRAVASCULAR ULTRASOUND/IVUS N/A 02/18/2021   Procedure: Intravascular Ultrasound/IVUS;  Surgeon: Burnell Blanks, MD;  Location: [place] [facility];  Service: Cardiovascular;  Laterality: N/A;   LEFT HEART CATH AND CORONARY ANGIOGRAPHY N/A 02/18/2021   Procedure: LEFT HEART CATH AND CORONARY ANGIOGRAPHY;  Surgeon: Burnell Blanks, MD;  Location: [place] [facility];  Service: Cardiovascular;  Laterality: N/A;    Current Medications: No outpatient medications have been marked as taking for the 08/17/21 encounter (Appointment) with Deberah Pelton, NP.     Allergies:   Patient has no known allergies.   Social History   Socioeconomic History   Marital status: Married    Spouse name: Not on file   Number of children: Not on file   Years of education: Not on file  Highest education level: Not on file  Occupational History   Not on file  Tobacco Use   Smoking status: Never   Smokeless tobacco: Never  Substance and Sexual Activity   Alcohol use: Yes    Alcohol/week: 0.0 standard drinks    Comment: 1 drink twice per month   Drug use: Never   Sexual activity: Not on file  Other Topics Concern   Not on file  Social History Narrative   Updated 01/2015   Work or Arboriculturist - Architect      Home Situation:  lives with wife and children - wife and children see me      Spiritual Beliefs: Christian - strong faith      Lifestyle: runs several miles per day, weights; healthy diet      Social Determinants of Health   Financial Resource Strain: Not on file  Food Insecurity: Not on file  Transportation Needs: Not on file  Physical Activity: Not on file  Stress: Not on file  Social Connections: Not on file     Family History: The patient's ***family history includes Alcoholism in his daughter; Alzheimer's disease in his paternal grandmother; Alzheimer's disease (age of onset: 36) in his father; Arthritis in his paternal grandmother; Congestive Heart Failure in his mother; Healthy in his sister and sister; Kidney disease in his father; Lung disease in his mother; Mental illness in his son.  ROS:   Please see the history of present illness.    *** All other systems reviewed and are negative.   Risk Assessment/Calculations:   {Does this patient have ATRIAL FIBRILLATION?[phone]}       Physical Exam:    VS:  There were no vitals taken for this visit.    Wt Readings from Last 3 Encounters:  03/09/21 195 lb (88.5 kg)  02/18/21 198 lb (89.8 kg)  05/23/19 205 lb (93 kg)     GEN: *** Well nourished, well developed in no acute distress HEENT: Normal NECK: No JVD; No carotid bruits LYMPHATICS: No lymphadenopathy CARDIAC: ***RRR, no murmurs, rubs, gallops RESPIRATORY:  Clear to auscultation without rales, wheezing or rhonchi  ABDOMEN: Soft, non-tender, non-distended MUSCULOSKELETAL:  No edema; No deformity  SKIN: Warm and dry NEUROLOGIC:  Alert and oriented x 3 PSYCHIATRIC:  Normal affect    EKGs/Labs/Other Studies Reviewed:    The following studies were reviewed today:  Echocardiogram 02/18/2021 IMPRESSIONS     1. Left ventricular ejection fraction, by estimation, is 60 to 65%. The  left ventricle has normal function. The left ventricle has no regional  wall motion  abnormalities. Left ventricular diastolic parameters were  normal.   2. Right ventricular systolic function is normal. The right ventricular  size is normal.   3. Right atrial size was mildly dilated.   4. The mitral valve is normal in structure. No evidence of mitral valve  regurgitation. No evidence of mitral stenosis.   5. The aortic valve is normal in structure. Aortic valve regurgitation is  not visualized. No aortic stenosis is present.     Cardiac catheterization 02/18/2021   Prox LAD to Mid LAD lesion is 95% stenosed.   A drug-eluting stent was successfully placed using a STENT ONYX FRONTIER [id number].   Post intervention, there is a 0% residual stenosis.   Acute anterior ST elevation MI secondary to severe mid LAD stenosis Successful PTCA/DES x 1 mid LAD No obstructive disease in the RCA or Circumflex.  Normal LVEDP   Admit to ICU. Echo later today. DAPT  with ASA and Brilinta for at least 12 months. Will start high intensity statin and a beta blocker. Likely d/c home tomorrow.    Diagnostic Dominance: Right Intervention   EKG:  EKG is *** ordered today.  The ekg ordered today demonstrates ***  Recent Labs: 02/18/2021: ALT 29 02/19/2021: BUN 14; Creatinine, Ser 1.01; Hemoglobin 15.9; Platelets 209; Potassium 3.9; Sodium 139  Recent Lipid Panel    Component Value Date/Time   CHOL 212 (H) 02/18/2021 0902   TRIG 87 02/18/2021 0902   HDL 44 02/18/2021 0902   CHOLHDL 4.8 02/18/2021 0902   VLDL 17 02/18/2021 0902   LDLCALC 151 (H) 02/18/2021 0902    ASSESSMENT & PLAN    Coronary artery disease-no recent episodes of arm neck back or chest discomfort.  Continues to be very physically active.  Underwent PCI with DES to his mid LAD 02/18/2021. Continue aspirin, Brilinta, atorvastatin, metoprolol, lisinopril, sublingual nitroglycerin Heart healthy low-sodium diet-salty 6 given Increase physical activity as tolerated  Hyperlipidemia-02/18/2021: Cholesterol 212; HDL 44;  LDL Cholesterol 151; Triglycerides 87; VLDL 17 Continue aspirin, atorvastatin  Heart healthy low-sodium high-fiber diet Increase physical activity as tolerated repeat fasting lipids and LFTs  Essential hypertension-BP today***.  Well-controlled at home. Continue metoprolol, lisinopril, Heart healthy low-sodium diet-salty 6 given Increase physical activity as tolerated Maintain blood pressure log  GERD-reports intermittent periods of reflux with certain dietary selections GERD diet instructions and given   Disposition: Follow-up with Dr. Burt Knack or APP in 6 months.  {Are you ordering a CV Procedure (e.g. stress test, cath, DCCV, TEE, etc)?   Press F2        :[id number]    Medication Adjustments/Labs and Tests Ordered: Current medicines are reviewed at length with the patient today.  Concerns regarding medicines are outlined above.  No orders of the defined types were placed in this encounter.  No orders of the defined types were placed in this encounter.   There are no Patient Instructions on file for this visit.   Signed, Deberah Pelton, NP  08/16/2021 6:32 AM      Notice: This dictation was prepared with Dragon dictation along with smaller phrase technology. Any transcriptional errors that result from this process are unintentional and may not be corrected upon review.  I spent***minutes examining this patient, reviewing medications, and using patient centered shared decision making involving her cardiac care.  Prior to her visit I spent greater than 20 minutes reviewing her past medical history,  medications, and prior cardiac tests.

## 2021-08-17 ENCOUNTER — Ambulatory Visit (HOSPITAL_BASED_OUTPATIENT_CLINIC_OR_DEPARTMENT_OTHER): Payer: BLUE CROSS/BLUE SHIELD | Admitting: General Practice

## 2021-09-21 ENCOUNTER — Ambulatory Visit (HOSPITAL_BASED_OUTPATIENT_CLINIC_OR_DEPARTMENT_OTHER): Payer: BLUE CROSS/BLUE SHIELD | Admitting: Family

## 2021-10-19 ENCOUNTER — Ambulatory Visit (HOSPITAL_BASED_OUTPATIENT_CLINIC_OR_DEPARTMENT_OTHER): Payer: BLUE CROSS/BLUE SHIELD | Admitting: Family

## 2022-03-07 ENCOUNTER — Other Ambulatory Visit: Payer: Self-pay | Admitting: Physician Assistant

## 2022-05-04 ENCOUNTER — Ambulatory Visit (INDEPENDENT_AMBULATORY_CARE_PROVIDER_SITE_OTHER): Payer: No Typology Code available for payment source | Admitting: Family Medicine

## 2022-05-04 ENCOUNTER — Encounter: Payer: Self-pay | Admitting: Family Medicine

## 2022-05-04 VITALS — BP 122/80 | HR 66 | Temp 98.0°F | Ht 71.0 in | Wt 202.3 lb

## 2022-05-04 DIAGNOSIS — E782 Mixed hyperlipidemia: Secondary | ICD-10-CM

## 2022-05-04 DIAGNOSIS — I2510 Atherosclerotic heart disease of native coronary artery without angina pectoris: Secondary | ICD-10-CM | POA: Diagnosis not present

## 2022-05-04 DIAGNOSIS — H9313 Tinnitus, bilateral: Secondary | ICD-10-CM | POA: Insufficient documentation

## 2022-05-04 DIAGNOSIS — Z1211 Encounter for screening for malignant neoplasm of colon: Secondary | ICD-10-CM

## 2022-05-04 DIAGNOSIS — K219 Gastro-esophageal reflux disease without esophagitis: Secondary | ICD-10-CM

## 2022-05-04 DIAGNOSIS — G720 Drug-induced myopathy: Secondary | ICD-10-CM

## 2022-05-04 DIAGNOSIS — G4700 Insomnia, unspecified: Secondary | ICD-10-CM

## 2022-05-04 DIAGNOSIS — T466X5A Adverse effect of antihyperlipidemic and antiarteriosclerotic drugs, initial encounter: Secondary | ICD-10-CM | POA: Insufficient documentation

## 2022-05-04 MED ORDER — TICAGRELOR 60 MG PO TABS: 60.0000 mg | ORAL_TABLET | Freq: Two times a day (BID) | ORAL | 1 refills | Status: DC

## 2022-05-04 MED ORDER — METOPROLOL SUCCINATE ER 25 MG PO TB24: 25.0000 mg | ORAL_TABLET | Freq: Every day | ORAL | 3 refills | Status: DC

## 2022-05-04 NOTE — Assessment & Plan Note (Signed)
Pt reports inability to tolerate statins, will check new lipid panel today.

## 2022-05-04 NOTE — Patient Instructions (Addendum)
Melatonin 5 mg, may be up to 20 mg as needed at bedtime.  Tylenol PM for sleep  Pepcid AC, can take 1 tablet twice a day as needed for acid reflux,   Smaller more frequent meals  Nothing to eat at least 3 hours before bedtime.  Sleeping on a wedge pillow may help

## 2022-05-04 NOTE — Assessment & Plan Note (Signed)
We discussed dietary changes, small meals and no eating before bed, I recommended using pepcid AC 1 tablet twice a day as needed. If sx worsen or persist he may need PPI but his sx are not daily at this time.

## 2022-05-04 NOTE — Assessment & Plan Note (Signed)
Reviewed the causes of tinnitus with patient, most likely it is related to hearing loss, will send patient to audiology for a full evaluation.

## 2022-05-04 NOTE — Assessment & Plan Note (Signed)
Occasional, we discussed using melatonin starting at 5 mg and also tylenol PM OTC for his occasional insomnia.

## 2022-05-04 NOTE — Progress Notes (Signed)
Established Patient Office Visit  Subjective   Patient ID: Melvin Faulkner, male    DOB: Jan 02, 1956  Age: 67 y.o. MRN: [id number]  Chief Complaint  Patient presents with   Establish Care    Patient is here to re-establish care. Pt saw Dr. Ethlyn Gallery last 05/23/2019.  Pt reports since then he did have a heart attack last year, 02/18/2021, he had a stent placed in the LAD. He was sent home on medication including atorvastatin 80 mg, brilinta 60 mg, and metoprolol 25 mg. Pt reports that he is taking the metoprolol 25 mg just once daily. Pt states that he has tried statins multiple times in the past, states that he was never really able to tolerate them. States that he was getting malaise, muscle cramps and fatigue. States that he has tried multiple statins with the same side effects.  Patient also reports occasional acid reflux, states that it happens mostly when eating, states that he does get a little regurgitation/ vomiting sometimes. Happens about once a week. States that it has also happened at night when he has been asleep, will wake him from sleep and he will cough.   Pt is also reporting progressive worsening of his bilateral ringing in the ears. 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 Has been present for several years but it seems to be getting worse. Denies any vertigo, no dizziness or headaches, no infections, no sinus problems, no hearing loss that he is aware of.   Pt also reporting some occasional trouble sleeping at night. States that he is still working full time and his job is very stressful. Used to take [place] occasionally. Reports when he is able to sleep he sleeps very well.    Current Outpatient Medications  Medication Instructions   aspirin EC 81 mg, Oral, Daily, Swallow whole.   metoprolol succinate (TOPROL-XL) 25 mg, Oral, Daily   nitroGLYCERIN (NITROSTAT) 0.4 mg, Sublingual, Every 5 min PRN   ticagrelor  (BRILINTA) 60 mg, Oral, 2 times daily     Patient Active Problem List   Diagnosis Date Noted   Statin myopathy 05/04/2022   Tinnitus aurium, bilateral 05/04/2022   Coronary artery disease involving native coronary artery of native heart 05/04/2022   Acute ST elevation myocardial infarction (STEMI) of inferior wall ([place]) 02/18/2021   Acute ST elevation myocardial infarction (STEMI) involving left anterior descending coronary artery ([place]) 02/18/2021   Acute ST elevation myocardial infarction (STEMI) involving left anterior descending (LAD) coronary artery ([place])    Esophageal reflux 11/16/2015   Essential hypertension 02/16/2015   Hyperlipemia 02/16/2015   Insomnia 02/16/2015   Stress at work 02/16/2015      Review of Systems  All other systems reviewed and are negative.     Objective:     BP 122/80 (BP Location: Right Arm, Patient Position: Sitting, Cuff Size: Large)   Pulse 66   Temp 98 F (36.7 C) (Oral)   Ht 5\' 11"  (1.803 m)   Wt 202 lb 4.8 oz (91.8 kg)   SpO2 97%   BMI 28.22 kg/m    Physical Exam Vitals reviewed.  Constitutional:      Appearance: Normal appearance. He is well-groomed and normal weight.  HENT:     Right Ear: Tympanic membrane and ear canal normal.     Left Ear: Tympanic membrane and ear canal normal.  Eyes:     Extraocular Movements: Extraocular movements intact.  Conjunctiva/sclera: Conjunctivae normal.  Neck:     Thyroid: No thyromegaly.  Cardiovascular:     Rate and Rhythm: Normal rate and regular rhythm.     Heart sounds: S1 normal and S2 normal. No murmur heard. Pulmonary:     Effort: Pulmonary effort is normal.     Breath sounds: Normal breath sounds and air entry. No wheezing or rales.  Abdominal:     General: Bowel sounds are normal.  Musculoskeletal:     Right lower leg: No edema.     Left lower leg: No edema.  Neurological:     General: No focal deficit present.     Mental Status: He is alert and oriented to person, place,  and time.     Gait: Gait is intact.  Psychiatric:        Mood and Affect: Mood and affect normal.      No results found for any visits on 05/04/22.    The ASCVD Risk score (Arnett DK, et al., 2019) failed to calculate for the following reasons:   The patient has a prior MI or stroke diagnosis    Assessment & Plan:   Problem List Items Addressed This Visit       Unprioritized   Hyperlipemia    Pt reports inability to tolerate statins, will check new lipid panel today.       Relevant Medications   metoprolol succinate (TOPROL-XL) 25 MG 24 hr tablet   Other Relevant Orders   Lipid Panel   Insomnia    Occasional, we discussed using melatonin starting at 5 mg and also tylenol PM OTC for his occasional insomnia.       Esophageal reflux    We discussed dietary changes, small meals and no eating before bed, I recommended using pepcid AC 1 tablet twice a day as needed. If sx worsen or persist he may need PPI but his sx are not daily at this time.      Statin myopathy (Chronic)   Tinnitus aurium, bilateral    Reviewed the causes of tinnitus with patient, most likely it is related to hearing loss, will send patient to audiology for a full evaluation.      Relevant Orders   Ambulatory referral to Audiology   Coronary artery disease involving native coronary artery of native heart - Primary    S/p AMI in October 2022, was on 90 mg brilinta BID, since it has been >1 year since his stent placement to the LAD, will reduce dose to 60 mg BID. Pt does not wish for a new cardiology referral at this time, states he is not having any chest pain or SOB since the hospitalization and stent placement. Will continue metoprolol 25 mg (changed to the XL version) once daily.       Relevant Medications   ticagrelor (BRILINTA) 60 MG TABS tablet   metoprolol succinate (TOPROL-XL) 25 MG 24 hr tablet   Other Relevant Orders   CMP   CBC (no diff)   Other Visit Diagnoses     Colon cancer  screening       Relevant Orders   Cologuard     I spent 43 minutes with the patient today counselling on sleep disturbance, occasional acid reflux symptoms at home.   Return in about 1 year (around 05/05/2023) for Annual Physical Exam.    Farrel Conners, MD

## 2022-05-04 NOTE — Assessment & Plan Note (Addendum)
S/p AMI in October 2022, was on 90 mg brilinta BID, since it has been >1 year since his stent placement to the LAD, will reduce dose to 60 mg BID. Pt does not wish for a new cardiology referral at this time, states he is not having any chest pain or SOB since the hospitalization and stent placement. Will continue metoprolol 25 mg (changed to the XL version) once daily.

## 2022-05-11 ENCOUNTER — Ambulatory Visit: Payer: No Typology Code available for payment source | Attending: Audiologist | Admitting: Audiologist

## 2022-05-11 DIAGNOSIS — H9313 Tinnitus, bilateral: Secondary | ICD-10-CM | POA: Diagnosis present

## 2022-05-11 DIAGNOSIS — H903 Sensorineural hearing loss, bilateral: Secondary | ICD-10-CM | POA: Diagnosis present

## 2022-05-11 NOTE — Procedures (Signed)
  [facility] [place],   [id number] [phone]  AUDIOLOGICAL  EVALUATION  NAME: Melvin Faulkner     DOB:   03-Aug-1955      MRN: [id number]                                                                                     DATE: 05/11/2022     REFERENT: Farrel Conners, MD STATUS: Outpatient DIAGNOSIS: Tinnitus, Sensorineural Hearing Loss     History: Jihaad was seen for an audiological evaluation. Galen is receiving a hearing evaluation due to concerns for ringing in his ears . Candice has no difficulty hearing. The ringing started over ten years ago and has gradually gotten worse. No pain or pressure reported in either ear. Tinnitus is in both ears and sounds like /eeee/. Theresa has a history of noise exposure from music, firearms, and "life in the 67s" per patient report. Toshio uses a noise machine at night which helps him sleep. He is concerned that the ringing will get worse. Medical history negative for a health condition which is a risk factor for hearing loss. No other relevant case history reported.   Evaluation:  Otoscopy showed a clear view of the tympanic membranes, bilaterally Tympanometry results were consistent with normal middle ear function, bilaterally   Audiometric testing was completed using conventional audiometry with supraural high frequency transducer. Speech Recognition Thresholds were  15dB in the right ear and 10dB in the left ear. Word Recognition was performed  40dB SL, scored  100% in the right ear and 100% in the left ear. Pure tone thresholds show normal hearing sloping after 4kHz to a moderately severe sensorineural hearing loss in both ears.  Tinnitus matched to [facility] tone at [employer], which he rated a 7 on a scale of 1-10 with 10 a perfect match.      Results:  The test results were reviewed with [facility]. Jayce has mostly normal hearing that slopes after 4kHz to a moderately severe  sensorineural hearing loss in each ear. His tinnitus was matched to a 6kHz tone at [employer], which is a very soft volume at a high pitch. The tinnitus is due to his hearing loss. He does not yet need hearing aids. He was given a packet of information on how to use masking sounds to decrease tinnitus awareness.   Recommendations: 1.   Monitor hearing annually due to tinnitus. Keep implementing strategies of sound management. See tinnitus packet for details of what was discussed today.    32 minutes spent testing and counseling on results.   Alfonse Alpers  Audiologist, Au.D., CCC-A 05/11/2022  2:14 PM  Cc: Farrel Conners, MD

## 2022-06-01 LAB — COLOGUARD: COLOGUARD: NEGATIVE

## 2022-06-06 ENCOUNTER — Telehealth: Payer: Self-pay | Admitting: Family Medicine

## 2022-06-06 DIAGNOSIS — I2510 Atherosclerotic heart disease of native coronary artery without angina pectoris: Secondary | ICD-10-CM

## 2022-06-06 MED ORDER — METOPROLOL SUCCINATE ER 25 MG PO TB24: 25.0000 mg | ORAL_TABLET | Freq: Every day | ORAL | 3 refills | Status: DC

## 2022-06-06 MED ORDER — TICAGRELOR 60 MG PO TABS
60.0000 mg | ORAL_TABLET | Freq: Two times a day (BID) | ORAL | 1 refills | Status: DC
Start: 2022-06-06 — End: 2022-12-05

## 2022-06-06 NOTE — Telephone Encounter (Signed)
Rx done and denial for Nitroglycerin sent as this was prescribed by cardiologist.

## 2022-06-06 NOTE — Telephone Encounter (Addendum)
Pt called to request a refill of the following:  ticagrelor (BRILINTA) 60 MG TABS tablet  metoprolol succinate (TOPROL-XL) 25 MG 24 hr tablet  nitroGLYCERIN (NITROSTAT) 0.4 MG SL tablet   Pt states MD sent in the prescriptions last month, but Pt was unable to pick them up.  When Pt finally went in to the pharmacy to get his medications, he was told they had to be restocked and MD will need to resend another prescription to the pharmacy, respectfully.  LOV:   05/04/2022  Please advise.  [facility] [place], [place] - [address] AT [address] OF [address] Phone: [phone]  Fax: [phone]

## 2022-12-05 ENCOUNTER — Other Ambulatory Visit: Payer: Self-pay | Admitting: Family Medicine

## 2022-12-05 DIAGNOSIS — I2510 Atherosclerotic heart disease of native coronary artery without angina pectoris: Secondary | ICD-10-CM

## 2023-05-07 ENCOUNTER — Encounter: Payer: No Typology Code available for payment source | Admitting: Family Medicine

## 2023-07-02 ENCOUNTER — Other Ambulatory Visit: Payer: Self-pay | Admitting: Family Medicine

## 2023-07-02 DIAGNOSIS — I2510 Atherosclerotic heart disease of native coronary artery without angina pectoris: Secondary | ICD-10-CM

## 2023-07-09 ENCOUNTER — Encounter: Payer: No Typology Code available for payment source | Admitting: Family Medicine

## 2023-08-07 ENCOUNTER — Other Ambulatory Visit: Payer: Self-pay | Admitting: Family Medicine

## 2023-08-07 DIAGNOSIS — I2510 Atherosclerotic heart disease of native coronary artery without angina pectoris: Secondary | ICD-10-CM

## 2023-10-22 ENCOUNTER — Other Ambulatory Visit: Payer: Self-pay

## 2023-11-01 ENCOUNTER — Encounter: Payer: Self-pay | Admitting: Family Medicine

## 2023-11-01 ENCOUNTER — Ambulatory Visit (INDEPENDENT_AMBULATORY_CARE_PROVIDER_SITE_OTHER): Payer: Self-pay | Admitting: Family Medicine

## 2023-11-01 VITALS — BP 140/80 | HR 60 | Temp 97.7°F | Ht 70.75 in | Wt 203.6 lb

## 2023-11-01 DIAGNOSIS — I2510 Atherosclerotic heart disease of native coronary artery without angina pectoris: Secondary | ICD-10-CM | POA: Diagnosis not present

## 2023-11-01 DIAGNOSIS — E785 Hyperlipidemia, unspecified: Secondary | ICD-10-CM

## 2023-11-01 DIAGNOSIS — Z0000 Encounter for general adult medical examination without abnormal findings: Secondary | ICD-10-CM | POA: Diagnosis not present

## 2023-11-01 DIAGNOSIS — I10 Essential (primary) hypertension: Secondary | ICD-10-CM

## 2023-11-01 DIAGNOSIS — Z125 Encounter for screening for malignant neoplasm of prostate: Secondary | ICD-10-CM

## 2023-11-01 LAB — LIPID PANEL
Cholesterol: 230 mg/dL — ABNORMAL HIGH (ref 0–200)
HDL: 46.4 mg/dL (ref >39.00)
LDL Cholesterol: 160 mg/dL — ABNORMAL HIGH (ref 0–99)
NonHDL: 183.61
Total CHOL/HDL Ratio: 5
Triglycerides: 119 mg/dL (ref 0.0–149.0)
VLDL: 23.8 mg/dL (ref 0.0–40.0)

## 2023-11-01 LAB — PSA: PSA: 2.14 ng/mL (ref 0.10–4.00)

## 2023-11-01 MED ORDER — ATORVASTATIN CALCIUM 40 MG PO TABS: 40.0000 mg | ORAL_TABLET | Freq: Every day | ORAL | 3 refills | Status: AC

## 2023-11-01 MED ORDER — METOPROLOL SUCCINATE ER 25 MG PO TB24: 25.0000 mg | ORAL_TABLET | Freq: Every day | ORAL | 1 refills | Status: DC

## 2023-11-01 NOTE — Patient Instructions (Signed)
 Health Maintenance, Male  Adopting a healthy lifestyle and getting preventive care are important in promoting health and wellness. Ask your health care provider about:  The right schedule for you to have regular tests and exams.  Things you can do on your own to prevent diseases and keep yourself healthy.  What should I know about diet, weight, and exercise?  Eat a healthy diet    Eat a diet that includes plenty of vegetables, fruits, low-fat dairy products, and lean protein.  Do not eat a lot of foods that are high in solid fats, added sugars, or sodium.  Maintain a healthy weight  Body mass index (BMI) is a measurement that can be used to identify possible weight problems. It estimates body fat based on height and weight. Your health care provider can help determine your BMI and help you achieve or maintain a healthy weight.  Get regular exercise  Get regular exercise. This is one of the most important things you can do for your health. Most adults should:  Exercise for at least 150 minutes each week. The exercise should increase your heart rate and make you sweat (moderate-intensity exercise).  Do strengthening exercises at least twice a week. This is in addition to the moderate-intensity exercise.  Spend less time sitting. Even light physical activity can be beneficial.  Watch cholesterol and blood lipids  Have your blood tested for lipids and cholesterol at 68 years of age, then have this test every 5 years.  You may need to have your cholesterol levels checked more often if:  Your lipid or cholesterol levels are high.  You are older than 68 years of age.  You are at high risk for heart disease.  What should I know about cancer screening?  Many types of cancers can be detected early and may often be prevented. Depending on your health history and family history, you may need to have cancer screening at various ages. This may include screening for:  Colorectal cancer.  Prostate cancer.  Skin cancer.  Lung  cancer.  What should I know about heart disease, diabetes, and high blood pressure?  Blood pressure and heart disease  High blood pressure causes heart disease and increases the risk of stroke. This is more likely to develop in people who have high blood pressure readings or are overweight.  Talk with your health care provider about your target blood pressure readings.  Have your blood pressure checked:  Every 3-5 years if you are 9-95 years of age.  Every year if you are 85 years old or older.  If you are between the ages of 29 and 29 and are a current or former smoker, ask your health care provider if you should have a one-time screening for abdominal aortic aneurysm (AAA).  Diabetes  Have regular diabetes screenings. This checks your fasting blood sugar level. Have the screening done:  Once every three years after age 23 if you are at a normal weight and have a low risk for diabetes.  More often and at a younger age if you are overweight or have a high risk for diabetes.  What should I know about preventing infection?  Hepatitis B  If you have a higher risk for hepatitis B, you should be screened for this virus. Talk with your health care provider to find out if you are at risk for hepatitis B infection.  Hepatitis C  Blood testing is recommended for:  Everyone born from [birth year] through 1965.  Anyone  with known risk factors for hepatitis C.  Sexually transmitted infections (STIs)  You should be screened each year for STIs, including gonorrhea and chlamydia, if:  You are sexually active and are younger than 68 years of age.  You are older than 68 years of age and your health care provider tells you that you are at risk for this type of infection.  Your sexual activity has changed since you were last screened, and you are at increased risk for chlamydia or gonorrhea. Ask your health care provider if you are at risk.  Ask your health care provider about whether you are at high risk for HIV. Your health care provider  may recommend a prescription medicine to help prevent HIV infection. If you choose to take medicine to prevent HIV, you should first get tested for HIV. You should then be tested every 3 months for as long as you are taking the medicine.  Follow these instructions at home:  Alcohol use  Do not drink alcohol if your health care provider tells you not to drink.  If you drink alcohol:  Limit how much you have to 0-2 drinks a day.  Know how much alcohol is in your drink. In the [place] one drink equals one 12 oz bottle of beer (355 mL), one 5 oz glass of wine (148 mL), or one 1 oz glass of hard liquor (44 mL).  Lifestyle  Do not use any products that contain nicotine or tobacco. These products include cigarettes, chewing tobacco, and vaping devices, such as e-cigarettes. If you need help quitting, ask your health care provider.  Do not use street drugs.  Do not share needles.  Ask your health care provider for help if you need support or information about quitting drugs.  General instructions  Schedule regular health, dental, and eye exams.  Stay current with your vaccines.  Tell your health care provider if:  You often feel depressed.  You have ever been abused or do not feel safe at home.  Summary  Adopting a healthy lifestyle and getting preventive care are important in promoting health and wellness.  Follow your health care provider's instructions about healthy diet, exercising, and getting tested or screened for diseases.  Follow your health care provider's instructions on monitoring your cholesterol and blood pressure.  This information is not intended to replace advice given to you by your health care provider. Make sure you discuss any questions you have with your health care provider.  Document Revised: 08/30/2020 Document Reviewed: 08/30/2020  Elsevier Patient Education  2024 [employer].

## 2023-11-01 NOTE — Progress Notes (Signed)
 Complete physical exam  Patient: Melvin Faulkner   DOB: 17-Apr-1956   68 y.o. Male  MRN: [id number]  Subjective:    Chief Complaint  Patient presents with   Annual Exam    Melvin Faulkner is a 68 y.o. male who presents today for a complete physical exam. He reports consuming a general and usually follows the mediterranean diet. Home exercise routine includes walking 2-3 hrs per week. Also swims in his pool daily over the summer. He generally feels well. He reports sleeping fairly well, often wakes up at night to use the BR, no other BPH symptoms. He does not have additional problems to discuss today.   HTN/HLD/CAD -- patient reports he stopped a lot of his medications including his statin medication. He reports he continued to take the metoprolol . Pt questioned the value of staying on the statin medication because he feels fine. I counseled the patient on the risks/benefits of statin medication for secondary prevention  Most recent fall risk assessment:     No data to display           Most recent depression screenings:    11/01/2023    2:01 PM 05/04/2022   10:34 AM  PHQ 2/9 Scores  PHQ - 2 Score 0 0  PHQ- 9 Score 1 1    Vision:Within last year and Dental: No current dental problems and Receives regular dental care  Patient Active Problem List   Diagnosis Date Noted   Statin myopathy 05/04/2022   Tinnitus aurium, bilateral 05/04/2022   Coronary artery disease involving native coronary artery of native heart 05/04/2022   Acute ST elevation myocardial infarction (STEMI) of inferior wall ([facility]) 02/18/2021   Acute ST elevation myocardial infarction (STEMI) involving left anterior descending coronary artery ([facility]) 02/18/2021   Acute ST elevation myocardial infarction (STEMI) involving left anterior descending (LAD) coronary artery ([facility])    Esophageal reflux 11/16/2015   Essential hypertension 02/16/2015   Hyperlipemia 02/16/2015   Insomnia 02/16/2015   Stress  at work 02/16/2015      Patient Care Team: Ozell Heron HERO, MD as PCP - General (Family Medicine) Wonda Ozell, MD as PCP - Cardiology (Cardiology)   Outpatient Medications Prior to Visit  Medication Sig   nitroGLYCERIN  (NITROSTAT ) 0.4 MG SL tablet Place 1 tablet (0.4 mg total) under the tongue every 5 (five) minutes as needed.   [DISCONTINUED] metoprolol  succinate (TOPROL -XL) 25 MG 24 hr tablet TAKE 1 TABLET(25 MG) BY MOUTH DAILY   [DISCONTINUED] aspirin  EC 81 MG tablet Take 1 tablet (81 mg total) by mouth daily. Swallow whole.   [DISCONTINUED] BRILINTA  60 MG TABS tablet TAKE 1 TABLET(60 MG) BY MOUTH TWICE DAILY   No facility-administered medications prior to visit.    Review of Systems  HENT:  Negative for hearing loss.   Eyes:  Negative for blurred vision.  Respiratory:  Negative for shortness of breath.   Cardiovascular:  Negative for chest pain.  Gastrointestinal: Negative.   Genitourinary: Negative.   Musculoskeletal:  Negative for back pain.  Neurological:  Negative for headaches.  Psychiatric/Behavioral:  Negative for depression.        Objective:     BP (!) 140/80   Pulse 60   Temp 97.7 F (36.5 C) (Oral)   Ht 5' 10.75 (1.797 m)   Wt 203 lb 9.6 oz (92.4 kg)   SpO2 98%   BMI 28.60 kg/m    Physical Exam Vitals reviewed.  Constitutional:      Appearance: Normal  appearance. He is well-groomed and normal weight.  HENT:     Right Ear: Tympanic membrane and ear canal normal.     Left Ear: Tympanic membrane and ear canal normal.     Mouth/Throat:     Mouth: Mucous membranes are moist.     Pharynx: No posterior oropharyngeal erythema.  Eyes:     Extraocular Movements: Extraocular movements intact.     Conjunctiva/sclera: Conjunctivae normal.  Neck:     Thyroid: No thyromegaly.  Cardiovascular:     Rate and Rhythm: Normal rate and regular rhythm.     Heart sounds: S1 normal and S2 normal. No murmur heard. Pulmonary:     Effort: Pulmonary effort is  normal.     Breath sounds: Normal breath sounds and air entry. No rales.  Abdominal:     General: Abdomen is flat. Bowel sounds are normal.  Musculoskeletal:     Right lower leg: No edema.     Left lower leg: No edema.  Lymphadenopathy:     Cervical: No cervical adenopathy.  Neurological:     General: No focal deficit present.     Mental Status: He is alert and oriented to person, place, and time.     Gait: Gait is intact.  Psychiatric:        Mood and Affect: Mood and affect normal.      Results for orders placed or performed in visit on 11/01/23  PSA  Result Value Ref Range   PSA 2.14 0.10 - 4.00 ng/mL  Lipid panel  Result Value Ref Range   Cholesterol 230 (H) 0 - 200 mg/dL   Triglycerides 880.9 0.0 - 149.0 mg/dL   HDL 53.59 >60.99 mg/dL   VLDL 76.1 0.0 - 59.9 mg/dL   LDL Cholesterol 839 (H) 0 - 99 mg/dL   Total CHOL/HDL Ratio 5    NonHDL 183.61        Assessment & Plan:    Routine Health Maintenance and Physical Exam  Immunization History  Administered Date(s) Administered   Influenza Inj Mdck Quad Pf 03/19/2017   Influenza,inj,Quad PF,6+ Mos 02/16/2015, 05/23/2016, 02/27/2018   Influenza-Unspecified 05/09/2011, 02/24/2014   Tdap 06/26/2011, 09/15/2014    Health Maintenance  Topic Date Due   Pneumococcal Vaccine: 50+ Years (1 of 2 - PCV) Never done   Zoster Vaccines- Shingrix (1 of 2) Never done   COVID-19 Vaccine (1 - 2024-25 season) Never done   INFLUENZA VACCINE  11/23/2023   DTaP/Tdap/Td (3 - Td or Tdap) 09/14/2024   Fecal DNA (Cologuard)  05/21/2025   Hepatitis C Screening  Completed   Hepatitis B Vaccines  Aged Out   HPV VACCINES  Aged Out   Meningococcal B Vaccine  Aged Out    Discussed health benefits of physical activity, and encouraged him to engage in regular exercise appropriate for his age and condition.  Essential hypertension Assessment & Plan: On metoprolol  25 mg daily, BP is borderline elevated in today's visit, I advised we continue  his current metoprolol  dose for now and I advised he continue to check his BP athome.    Coronary artery disease involving native coronary artery of native heart, unspecified whether angina present -     Lipid panel; Future -     Metoprolol  Succinate ER; Take 1 tablet (25 mg total) by mouth daily.  Dispense: 90 tablet; Refill: 1 -     Atorvastatin  Calcium ; Take 1 tablet (40 mg total) by mouth daily.  Dispense: 90 tablet; Refill: 3  Prostate cancer screening -     PSA; Future  Routine general medical examination at a health care facility  Hyperlipidemia, unspecified hyperlipidemia type Assessment & Plan: With history of ACS and stent placement, I strongly advised that he restart his atorvastatin  for secondary prevention. I counseled the patient for 20 minutes on this and he is agreeable to restart the meds. Will check lipid panel today to get a baseline and then again in 6 months.    Normal physical exam findings. I counseled the patient on the recommended amount of exercise per CDC recommendation. I reviewed preventative screening, immunizations, and medical history and updated in the chart, and appropriate labs and vaccinations were ordered. Handouts given on healthy eating and exercise.    Return in about 6 months (around 05/03/2024) for HTN, HLD follow up.     Heron CHRISTELLA Sharper, MD

## 2023-11-05 ENCOUNTER — Ambulatory Visit: Payer: Self-pay | Admitting: Family Medicine

## 2023-11-08 NOTE — Assessment & Plan Note (Signed)
 With history of ACS and stent placement, I strongly advised that he restart his atorvastatin  for secondary prevention. I counseled the patient for 20 minutes on this and he is agreeable to restart the meds. Will check lipid panel today to get a baseline and then again in 6 months.

## 2023-11-08 NOTE — Assessment & Plan Note (Signed)
 On metoprolol  25 mg daily, BP is borderline elevated in today's visit, I advised we continue his current metoprolol  dose for now and I advised he continue to check his BP athome.

## 2023-11-22 ENCOUNTER — Encounter: Payer: Self-pay | Admitting: Family Medicine

## 2023-11-22 DIAGNOSIS — I10 Essential (primary) hypertension: Secondary | ICD-10-CM

## 2024-02-06 ENCOUNTER — Other Ambulatory Visit

## 2024-05-23 ENCOUNTER — Other Ambulatory Visit: Payer: Self-pay | Admitting: Family Medicine

## 2024-05-23 DIAGNOSIS — I2510 Atherosclerotic heart disease of native coronary artery without angina pectoris: Secondary | ICD-10-CM
